# Patient Record
Sex: Female | Born: 1960 | Race: White | Hispanic: No | Marital: Married | Smoking: Never smoker
Health system: Southern US, Community
[De-identification: ages and names within clinical notes are randomized; demographics above are authoritative.]

## PROBLEM LIST (undated history)

## (undated) DIAGNOSIS — E860 Dehydration: Secondary | ICD-10-CM

## (undated) DIAGNOSIS — T7840XA Allergy, unspecified, initial encounter: Secondary | ICD-10-CM

## (undated) DIAGNOSIS — F32A Depression, unspecified: Secondary | ICD-10-CM

## (undated) DIAGNOSIS — J309 Allergic rhinitis, unspecified: Secondary | ICD-10-CM

## (undated) DIAGNOSIS — M8580 Other specified disorders of bone density and structure, unspecified site: Secondary | ICD-10-CM

## (undated) DIAGNOSIS — N390 Urinary tract infection, site not specified: Secondary | ICD-10-CM

## (undated) DIAGNOSIS — J45909 Unspecified asthma, uncomplicated: Secondary | ICD-10-CM

## (undated) DIAGNOSIS — Z9221 Personal history of antineoplastic chemotherapy: Secondary | ICD-10-CM

## (undated) DIAGNOSIS — G43909 Migraine, unspecified, not intractable, without status migrainosus: Secondary | ICD-10-CM

## (undated) DIAGNOSIS — C50919 Malignant neoplasm of unspecified site of unspecified female breast: Secondary | ICD-10-CM

## (undated) DIAGNOSIS — N189 Chronic kidney disease, unspecified: Secondary | ICD-10-CM

## (undated) DIAGNOSIS — F419 Anxiety disorder, unspecified: Secondary | ICD-10-CM

## (undated) DIAGNOSIS — H269 Unspecified cataract: Secondary | ICD-10-CM

## (undated) HISTORY — PX: SINUS SURGERY WITH INSTATRAK: SHX5215

## (undated) HISTORY — DX: Allergic rhinitis, unspecified: J30.9

## (undated) HISTORY — PX: COLONOSCOPY: SHX174

## (undated) HISTORY — PX: MASTECTOMY: SHX3

## (undated) HISTORY — DX: Allergy, unspecified, initial encounter: T78.40XA

## (undated) HISTORY — DX: Unspecified asthma, uncomplicated: J45.909

## (undated) HISTORY — DX: Unspecified cataract: H26.9

## (undated) HISTORY — DX: Depression, unspecified: F32.A

## (undated) HISTORY — DX: Anxiety disorder, unspecified: F41.9

## (undated) HISTORY — DX: Chronic kidney disease, unspecified: N18.9

## (undated) HISTORY — DX: Malignant neoplasm of unspecified site of unspecified female breast: C50.919

## (undated) HISTORY — PX: APPENDECTOMY: SHX54

## (undated) HISTORY — DX: Urinary tract infection, site not specified: N39.0

## (undated) HISTORY — DX: Other specified disorders of bone density and structure, unspecified site: M85.80

---

## 1999-12-16 ENCOUNTER — Other Ambulatory Visit: Admission: RE | Admit: 1999-12-16 | Discharge: 1999-12-16 | Payer: Self-pay | Admitting: Obstetrics and Gynecology

## 2000-01-09 ENCOUNTER — Encounter: Payer: Self-pay | Admitting: Obstetrics and Gynecology

## 2000-01-09 ENCOUNTER — Encounter: Admission: RE | Admit: 2000-01-09 | Discharge: 2000-01-09 | Payer: Self-pay | Admitting: Obstetrics and Gynecology

## 2000-01-20 ENCOUNTER — Encounter: Payer: Self-pay | Admitting: Obstetrics and Gynecology

## 2000-01-20 ENCOUNTER — Encounter: Payer: Self-pay | Admitting: Oncology

## 2000-01-20 ENCOUNTER — Encounter: Admission: RE | Admit: 2000-01-20 | Discharge: 2000-01-20 | Payer: Self-pay | Admitting: Obstetrics and Gynecology

## 2000-01-21 ENCOUNTER — Encounter: Payer: Self-pay | Admitting: Oncology

## 2000-01-21 ENCOUNTER — Encounter: Admission: RE | Admit: 2000-01-21 | Discharge: 2000-01-21 | Payer: Self-pay | Admitting: Oncology

## 2000-02-20 ENCOUNTER — Ambulatory Visit (HOSPITAL_COMMUNITY): Admission: RE | Admit: 2000-02-20 | Discharge: 2000-02-20 | Payer: Self-pay | Admitting: *Deleted

## 2000-03-03 ENCOUNTER — Ambulatory Visit (HOSPITAL_COMMUNITY): Admission: RE | Admit: 2000-03-03 | Discharge: 2000-03-03 | Payer: Self-pay | Admitting: Obstetrics and Gynecology

## 2000-03-03 ENCOUNTER — Encounter (INDEPENDENT_AMBULATORY_CARE_PROVIDER_SITE_OTHER): Payer: Self-pay | Admitting: Specialist

## 2001-01-05 ENCOUNTER — Encounter: Admission: RE | Admit: 2001-01-05 | Discharge: 2001-01-05 | Payer: Self-pay | Admitting: *Deleted

## 2001-01-26 ENCOUNTER — Other Ambulatory Visit: Admission: RE | Admit: 2001-01-26 | Discharge: 2001-01-26 | Payer: Self-pay | Admitting: Obstetrics and Gynecology

## 2001-02-09 ENCOUNTER — Encounter: Payer: Self-pay | Admitting: Oncology

## 2001-02-09 ENCOUNTER — Encounter: Admission: RE | Admit: 2001-02-09 | Discharge: 2001-02-09 | Payer: Self-pay | Admitting: Oncology

## 2002-01-27 ENCOUNTER — Encounter: Payer: Self-pay | Admitting: Obstetrics and Gynecology

## 2002-01-27 ENCOUNTER — Encounter: Admission: RE | Admit: 2002-01-27 | Discharge: 2002-01-27 | Payer: Self-pay | Admitting: Obstetrics and Gynecology

## 2002-02-14 ENCOUNTER — Other Ambulatory Visit: Admission: RE | Admit: 2002-02-14 | Discharge: 2002-02-14 | Payer: Self-pay | Admitting: Obstetrics and Gynecology

## 2002-02-28 ENCOUNTER — Encounter: Admission: RE | Admit: 2002-02-28 | Discharge: 2002-02-28 | Payer: Self-pay | Admitting: Oncology

## 2002-02-28 ENCOUNTER — Encounter: Payer: Self-pay | Admitting: Oncology

## 2003-03-02 ENCOUNTER — Encounter: Payer: Self-pay | Admitting: Oncology

## 2003-03-02 ENCOUNTER — Encounter: Admission: RE | Admit: 2003-03-02 | Discharge: 2003-03-02 | Payer: Self-pay | Admitting: Oncology

## 2003-05-19 ENCOUNTER — Ambulatory Visit (HOSPITAL_COMMUNITY): Admission: RE | Admit: 2003-05-19 | Discharge: 2003-05-19 | Payer: Self-pay | Admitting: Obstetrics and Gynecology

## 2003-05-19 ENCOUNTER — Encounter: Payer: Self-pay | Admitting: Obstetrics and Gynecology

## 2003-06-08 ENCOUNTER — Other Ambulatory Visit: Admission: RE | Admit: 2003-06-08 | Discharge: 2003-06-08 | Payer: Self-pay | Admitting: Obstetrics and Gynecology

## 2004-03-04 ENCOUNTER — Encounter: Admission: RE | Admit: 2004-03-04 | Discharge: 2004-03-04 | Payer: Self-pay | Admitting: Oncology

## 2004-06-18 ENCOUNTER — Other Ambulatory Visit: Admission: RE | Admit: 2004-06-18 | Discharge: 2004-06-18 | Payer: Self-pay | Admitting: Obstetrics and Gynecology

## 2004-06-24 ENCOUNTER — Ambulatory Visit (HOSPITAL_COMMUNITY): Admission: RE | Admit: 2004-06-24 | Discharge: 2004-06-24 | Payer: Self-pay | Admitting: Obstetrics and Gynecology

## 2004-07-19 ENCOUNTER — Ambulatory Visit: Payer: Self-pay | Admitting: Oncology

## 2004-10-16 ENCOUNTER — Ambulatory Visit (HOSPITAL_COMMUNITY): Admission: RE | Admit: 2004-10-16 | Discharge: 2004-10-16 | Payer: Self-pay | Admitting: Obstetrics and Gynecology

## 2004-12-09 ENCOUNTER — Ambulatory Visit (HOSPITAL_COMMUNITY): Admission: RE | Admit: 2004-12-09 | Discharge: 2004-12-09 | Payer: Self-pay | Admitting: Obstetrics and Gynecology

## 2005-01-28 ENCOUNTER — Ambulatory Visit: Payer: Self-pay | Admitting: Internal Medicine

## 2005-03-18 ENCOUNTER — Encounter: Admission: RE | Admit: 2005-03-18 | Discharge: 2005-03-18 | Payer: Self-pay | Admitting: Oncology

## 2005-05-30 ENCOUNTER — Ambulatory Visit: Payer: Self-pay | Admitting: Internal Medicine

## 2005-07-15 ENCOUNTER — Other Ambulatory Visit: Admission: RE | Admit: 2005-07-15 | Discharge: 2005-07-15 | Payer: Self-pay | Admitting: Obstetrics and Gynecology

## 2005-07-18 ENCOUNTER — Ambulatory Visit: Payer: Self-pay | Admitting: Oncology

## 2005-10-10 ENCOUNTER — Ambulatory Visit: Payer: Self-pay | Admitting: Internal Medicine

## 2005-11-21 ENCOUNTER — Ambulatory Visit: Payer: Self-pay | Admitting: Internal Medicine

## 2006-01-12 ENCOUNTER — Other Ambulatory Visit: Admission: RE | Admit: 2006-01-12 | Discharge: 2006-01-12 | Payer: Self-pay | Admitting: Obstetrics and Gynecology

## 2006-02-09 ENCOUNTER — Ambulatory Visit: Payer: Self-pay | Admitting: Internal Medicine

## 2006-02-16 ENCOUNTER — Ambulatory Visit: Payer: Self-pay | Admitting: Internal Medicine

## 2006-02-23 ENCOUNTER — Ambulatory Visit: Payer: Self-pay | Admitting: Internal Medicine

## 2006-03-02 ENCOUNTER — Ambulatory Visit: Payer: Self-pay | Admitting: Internal Medicine

## 2006-03-05 ENCOUNTER — Ambulatory Visit: Payer: Self-pay | Admitting: Internal Medicine

## 2006-03-10 ENCOUNTER — Ambulatory Visit: Payer: Self-pay | Admitting: Internal Medicine

## 2006-03-16 ENCOUNTER — Ambulatory Visit: Payer: Self-pay | Admitting: Internal Medicine

## 2006-03-19 ENCOUNTER — Encounter: Admission: RE | Admit: 2006-03-19 | Discharge: 2006-03-19 | Payer: Self-pay | Admitting: Oncology

## 2006-04-02 ENCOUNTER — Ambulatory Visit: Payer: Self-pay | Admitting: Internal Medicine

## 2006-04-06 ENCOUNTER — Ambulatory Visit: Payer: Self-pay | Admitting: Internal Medicine

## 2006-04-08 ENCOUNTER — Ambulatory Visit: Payer: Self-pay | Admitting: Internal Medicine

## 2006-04-13 ENCOUNTER — Ambulatory Visit: Payer: Self-pay | Admitting: Internal Medicine

## 2006-04-16 ENCOUNTER — Ambulatory Visit: Payer: Self-pay | Admitting: Internal Medicine

## 2006-04-20 ENCOUNTER — Ambulatory Visit: Payer: Self-pay | Admitting: Internal Medicine

## 2006-04-23 ENCOUNTER — Ambulatory Visit: Payer: Self-pay | Admitting: Internal Medicine

## 2006-04-27 ENCOUNTER — Ambulatory Visit: Payer: Self-pay | Admitting: Internal Medicine

## 2006-04-30 ENCOUNTER — Ambulatory Visit: Payer: Self-pay | Admitting: Internal Medicine

## 2006-05-06 ENCOUNTER — Ambulatory Visit: Payer: Self-pay | Admitting: Internal Medicine

## 2006-05-08 ENCOUNTER — Ambulatory Visit: Payer: Self-pay | Admitting: Internal Medicine

## 2006-05-12 ENCOUNTER — Ambulatory Visit: Payer: Self-pay | Admitting: Internal Medicine

## 2006-05-13 ENCOUNTER — Ambulatory Visit: Payer: Self-pay | Admitting: Internal Medicine

## 2006-05-14 ENCOUNTER — Ambulatory Visit: Payer: Self-pay | Admitting: Internal Medicine

## 2006-05-19 ENCOUNTER — Ambulatory Visit: Payer: Self-pay | Admitting: Internal Medicine

## 2006-05-22 ENCOUNTER — Ambulatory Visit: Payer: Self-pay | Admitting: Internal Medicine

## 2006-05-26 ENCOUNTER — Ambulatory Visit: Payer: Self-pay | Admitting: Internal Medicine

## 2006-05-29 ENCOUNTER — Ambulatory Visit: Payer: Self-pay | Admitting: Internal Medicine

## 2006-06-02 ENCOUNTER — Ambulatory Visit: Payer: Self-pay | Admitting: Internal Medicine

## 2006-06-05 ENCOUNTER — Ambulatory Visit: Payer: Self-pay | Admitting: Internal Medicine

## 2006-06-08 ENCOUNTER — Ambulatory Visit: Payer: Self-pay | Admitting: Internal Medicine

## 2006-06-09 ENCOUNTER — Ambulatory Visit: Payer: Self-pay | Admitting: Internal Medicine

## 2006-06-15 ENCOUNTER — Ambulatory Visit: Payer: Self-pay | Admitting: Internal Medicine

## 2006-08-05 ENCOUNTER — Ambulatory Visit: Payer: Self-pay | Admitting: Oncology

## 2006-08-10 LAB — CBC WITH DIFFERENTIAL/PLATELET
Basophils Absolute: 0 10*3/uL (ref 0.0–0.1)
EOS%: 3.9 % (ref 0.0–7.0)
HCT: 36.6 % (ref 34.8–46.6)
HGB: 12.7 g/dL (ref 11.6–15.9)
LYMPH%: 30.1 % (ref 14.0–48.0)
MCH: 32.4 pg (ref 26.0–34.0)
MCV: 93.6 fL (ref 81.0–101.0)
MONO%: 14.4 % — ABNORMAL HIGH (ref 0.0–13.0)
NEUT%: 51.1 % (ref 39.6–76.8)
Platelets: 214 10*3/uL (ref 145–400)
RDW: 12.8 % (ref 11.3–14.5)

## 2006-08-10 LAB — COMPREHENSIVE METABOLIC PANEL
AST: 16 U/L (ref 0–37)
Alkaline Phosphatase: 85 U/L (ref 39–117)
BUN: 11 mg/dL (ref 6–23)
Creatinine, Ser: 0.85 mg/dL (ref 0.40–1.20)
Glucose, Bld: 79 mg/dL (ref 70–99)
Total Bilirubin: 0.3 mg/dL (ref 0.3–1.2)

## 2006-09-18 ENCOUNTER — Ambulatory Visit: Payer: Self-pay | Admitting: Internal Medicine

## 2006-10-12 ENCOUNTER — Ambulatory Visit: Payer: Self-pay | Admitting: Internal Medicine

## 2006-11-02 ENCOUNTER — Ambulatory Visit: Payer: Self-pay | Admitting: Internal Medicine

## 2006-11-23 ENCOUNTER — Ambulatory Visit: Payer: Self-pay | Admitting: Internal Medicine

## 2007-01-13 ENCOUNTER — Other Ambulatory Visit: Admission: RE | Admit: 2007-01-13 | Discharge: 2007-01-13 | Payer: Self-pay | Admitting: Obstetrics and Gynecology

## 2007-01-20 ENCOUNTER — Encounter: Admission: RE | Admit: 2007-01-20 | Discharge: 2007-01-20 | Payer: Self-pay | Admitting: Obstetrics and Gynecology

## 2007-02-26 ENCOUNTER — Ambulatory Visit: Payer: Self-pay | Admitting: Internal Medicine

## 2007-03-04 ENCOUNTER — Encounter: Admission: RE | Admit: 2007-03-04 | Discharge: 2007-03-04 | Payer: Self-pay | Admitting: Family Medicine

## 2007-03-22 ENCOUNTER — Encounter: Admission: RE | Admit: 2007-03-22 | Discharge: 2007-03-22 | Payer: Self-pay | Admitting: Oncology

## 2007-04-26 ENCOUNTER — Ambulatory Visit: Payer: Self-pay | Admitting: Internal Medicine

## 2007-05-25 ENCOUNTER — Ambulatory Visit: Payer: Self-pay | Admitting: Internal Medicine

## 2007-08-02 ENCOUNTER — Ambulatory Visit: Payer: Self-pay | Admitting: Oncology

## 2007-08-09 ENCOUNTER — Encounter: Payer: Self-pay | Admitting: Internal Medicine

## 2007-08-09 LAB — CBC WITH DIFFERENTIAL/PLATELET
Basophils Absolute: 0.1 10*3/uL (ref 0.0–0.1)
EOS%: 3.6 % (ref 0.0–7.0)
Eosinophils Absolute: 0.2 10*3/uL (ref 0.0–0.5)
HCT: 39.6 % (ref 34.8–46.6)
HGB: 14 g/dL (ref 11.6–15.9)
MCH: 32.6 pg (ref 26.0–34.0)
MCV: 92.1 fL (ref 81.0–101.0)
MONO%: 7.3 % (ref 0.0–13.0)
NEUT#: 2.4 10*3/uL (ref 1.5–6.5)
NEUT%: 53.8 % (ref 39.6–76.8)

## 2007-08-09 LAB — COMPREHENSIVE METABOLIC PANEL
AST: 22 U/L (ref 0–37)
Albumin: 4.7 g/dL (ref 3.5–5.2)
BUN: 20 mg/dL (ref 6–23)
Calcium: 10.1 mg/dL (ref 8.4–10.5)
Chloride: 101 mEq/L (ref 96–112)
Creatinine, Ser: 1.13 mg/dL (ref 0.40–1.20)
Glucose, Bld: 70 mg/dL (ref 70–99)
Potassium: 3.8 mEq/L (ref 3.5–5.3)

## 2007-09-21 ENCOUNTER — Encounter: Admission: RE | Admit: 2007-09-21 | Discharge: 2007-09-21 | Payer: Self-pay | Admitting: Endocrinology

## 2007-10-25 DIAGNOSIS — Z9010 Acquired absence of unspecified breast and nipple: Secondary | ICD-10-CM | POA: Insufficient documentation

## 2007-10-25 DIAGNOSIS — J45998 Other asthma: Secondary | ICD-10-CM

## 2007-10-25 DIAGNOSIS — J302 Other seasonal allergic rhinitis: Secondary | ICD-10-CM

## 2007-10-25 DIAGNOSIS — J3089 Other allergic rhinitis: Secondary | ICD-10-CM

## 2007-10-26 ENCOUNTER — Encounter: Payer: Self-pay | Admitting: Internal Medicine

## 2007-11-18 ENCOUNTER — Ambulatory Visit: Payer: Self-pay | Admitting: Internal Medicine

## 2008-03-15 ENCOUNTER — Ambulatory Visit: Payer: Self-pay | Admitting: Internal Medicine

## 2008-03-22 ENCOUNTER — Encounter: Admission: RE | Admit: 2008-03-22 | Discharge: 2008-03-22 | Payer: Self-pay | Admitting: Oncology

## 2008-06-12 ENCOUNTER — Ambulatory Visit: Payer: Self-pay | Admitting: Internal Medicine

## 2008-08-04 ENCOUNTER — Ambulatory Visit: Payer: Self-pay | Admitting: Oncology

## 2008-08-08 ENCOUNTER — Encounter: Payer: Self-pay | Admitting: Internal Medicine

## 2008-08-08 LAB — COMPREHENSIVE METABOLIC PANEL
AST: 19 U/L (ref 0–37)
Albumin: 4.4 g/dL (ref 3.5–5.2)
Alkaline Phosphatase: 59 U/L (ref 39–117)
Glucose, Bld: 71 mg/dL (ref 70–99)
Potassium: 4.1 mEq/L (ref 3.5–5.3)
Sodium: 140 mEq/L (ref 135–145)
Total Bilirubin: 0.4 mg/dL (ref 0.3–1.2)
Total Protein: 6.7 g/dL (ref 6.0–8.3)

## 2008-08-08 LAB — CBC WITH DIFFERENTIAL/PLATELET
EOS%: 2.9 % (ref 0.0–7.0)
Eosinophils Absolute: 0.1 10*3/uL (ref 0.0–0.5)
LYMPH%: 33.7 % (ref 14.0–48.0)
MCH: 32.8 pg (ref 26.0–34.0)
MCHC: 34.9 g/dL (ref 32.0–36.0)
MCV: 94.1 fL (ref 81.0–101.0)
MONO%: 9.9 % (ref 0.0–13.0)
Platelets: 210 10*3/uL (ref 145–400)
RBC: 4.16 10*6/uL (ref 3.70–5.32)
RDW: 12.7 % (ref 11.3–14.5)

## 2008-08-23 ENCOUNTER — Ambulatory Visit: Payer: Self-pay | Admitting: Internal Medicine

## 2008-10-18 ENCOUNTER — Other Ambulatory Visit: Admission: RE | Admit: 2008-10-18 | Discharge: 2008-10-18 | Payer: Self-pay | Admitting: Obstetrics and Gynecology

## 2009-01-30 ENCOUNTER — Ambulatory Visit: Payer: Self-pay | Admitting: Internal Medicine

## 2009-03-23 ENCOUNTER — Encounter: Admission: RE | Admit: 2009-03-23 | Discharge: 2009-03-23 | Payer: Self-pay | Admitting: Oncology

## 2009-05-25 ENCOUNTER — Ambulatory Visit: Payer: Self-pay | Admitting: Oncology

## 2009-05-28 ENCOUNTER — Ambulatory Visit: Payer: Self-pay | Admitting: Internal Medicine

## 2009-05-29 LAB — COMPREHENSIVE METABOLIC PANEL
ALT: 20 U/L (ref 0–35)
AST: 20 U/L (ref 0–37)
Albumin: 4.5 g/dL (ref 3.5–5.2)
Alkaline Phosphatase: 79 U/L (ref 39–117)
Glucose, Bld: 109 mg/dL — ABNORMAL HIGH (ref 70–99)
Potassium: 4 mEq/L (ref 3.5–5.3)
Sodium: 142 mEq/L (ref 135–145)
Total Bilirubin: 0.3 mg/dL (ref 0.3–1.2)
Total Protein: 6.8 g/dL (ref 6.0–8.3)

## 2009-05-29 LAB — CBC WITH DIFFERENTIAL/PLATELET
BASO%: 1 % (ref 0.0–2.0)
EOS%: 3.4 % (ref 0.0–7.0)
Eosinophils Absolute: 0.2 10*3/uL (ref 0.0–0.5)
LYMPH%: 34.4 % (ref 14.0–49.7)
MCHC: 34.2 g/dL (ref 31.5–36.0)
MCV: 91.4 fL (ref 79.5–101.0)
MONO%: 9.2 % (ref 0.0–14.0)
NEUT#: 2.6 10*3/uL (ref 1.5–6.5)
RBC: 4.29 10*6/uL (ref 3.70–5.45)
RDW: 12.6 % (ref 11.2–14.5)

## 2009-07-03 ENCOUNTER — Encounter: Admission: RE | Admit: 2009-07-03 | Discharge: 2009-07-03 | Payer: Self-pay | Admitting: Oncology

## 2009-07-20 ENCOUNTER — Ambulatory Visit: Payer: Self-pay | Admitting: Oncology

## 2009-08-14 ENCOUNTER — Ambulatory Visit: Payer: Self-pay | Admitting: Internal Medicine

## 2009-11-28 ENCOUNTER — Other Ambulatory Visit: Admission: RE | Admit: 2009-11-28 | Discharge: 2009-11-28 | Payer: Self-pay | Admitting: Obstetrics and Gynecology

## 2010-04-24 ENCOUNTER — Ambulatory Visit: Payer: Self-pay | Admitting: Internal Medicine

## 2010-04-24 ENCOUNTER — Encounter: Admission: RE | Admit: 2010-04-24 | Discharge: 2010-04-24 | Payer: Self-pay | Admitting: Oncology

## 2010-06-27 ENCOUNTER — Ambulatory Visit: Payer: Self-pay | Admitting: Internal Medicine

## 2010-06-27 ENCOUNTER — Telehealth: Payer: Self-pay | Admitting: Internal Medicine

## 2010-07-22 ENCOUNTER — Ambulatory Visit: Payer: Self-pay | Admitting: Oncology

## 2010-07-24 LAB — CBC WITH DIFFERENTIAL/PLATELET
BASO%: 0.6 % (ref 0.0–2.0)
Basophils Absolute: 0 10*3/uL (ref 0.0–0.1)
EOS%: 3.1 % (ref 0.0–7.0)
Eosinophils Absolute: 0.1 10*3/uL (ref 0.0–0.5)
HCT: 34.9 % (ref 34.8–46.6)
HGB: 11.9 g/dL (ref 11.6–15.9)
LYMPH%: 35.3 % (ref 14.0–49.7)
MCH: 32.5 pg (ref 25.1–34.0)
MCHC: 34.2 g/dL (ref 31.5–36.0)
MCV: 95.1 fL (ref 79.5–101.0)
MONO#: 0.5 10*3/uL (ref 0.1–0.9)
MONO%: 12 % (ref 0.0–14.0)
NEUT#: 2.1 10*3/uL (ref 1.5–6.5)
NEUT%: 49 % (ref 38.4–76.8)
Platelets: 262 10*3/uL (ref 145–400)
RBC: 3.67 10*6/uL — ABNORMAL LOW (ref 3.70–5.45)
RDW: 13 % (ref 11.2–14.5)
WBC: 4.3 10*3/uL (ref 3.9–10.3)
lymph#: 1.5 10*3/uL (ref 0.9–3.3)

## 2010-07-24 LAB — MORPHOLOGY
PLT EST: ADEQUATE
RBC Comments: NORMAL

## 2010-09-28 ENCOUNTER — Other Ambulatory Visit: Payer: Self-pay | Admitting: Oncology

## 2010-09-28 DIAGNOSIS — Z1239 Encounter for other screening for malignant neoplasm of breast: Secondary | ICD-10-CM

## 2010-09-29 ENCOUNTER — Encounter: Payer: Self-pay | Admitting: Oncology

## 2010-10-03 ENCOUNTER — Ambulatory Visit: Payer: Self-pay | Admitting: Internal Medicine

## 2010-10-08 NOTE — Progress Notes (Signed)
Summary: nos appt  Phone Note Call from Patient   Caller: Patient Call For: Wynette Jersey Summary of Call: Rsc nos from 10/19 to 06/26/2011. Initial call taken by: Darletta Moll,  June 27, 2010 3:37 PM

## 2010-10-08 NOTE — Assessment & Plan Note (Signed)
Summary: rov//mbw   Primary [name]:  [name]  CC:  Follow up visit-allergies.  History of Present Illness: History of Present Illness: 06/12/08- 50 yo woman with hx allergic rhinitis and recurrent rhinosinusitis. Mild intermittent asthma.  One year f/u/.  In July flare, treated Urgent Care with inj, pred, abx.  Comfortable today. Allergy vaccine discussed.  Jun 21, 2009- Asthma, allergic rhinitis 2 weeks of watery rhinorhea with some frontal headache. It helps to do nasal saline rinse. Denies fever, purulence, blood, wheeze or cough. No recognized triggering exposure except Fall season. She continues allergy vaccine with no problems and had done well til now.   June 27, 2010- Asthma, allergic rhinitis Nurse CC: Follow up visit-allergies One year f/u, needing flu vax . Had 2 epoisodes of rhintis with head congestion but no wheeze or lung discomfort. The most recent was during the midsummer. She blames "allergies" although it wasn't pollen season. Antihistmines took care of her. Doing well with allergy shots- discussed.    Asthma History    Initial Asthma Severity Rating:    Age range: 12+ years    Symptoms: 0-2 days/week    Nighttime Awakenings: 0-2/month    Interferes w/ normal activity: no limitations    SABA use (not for EIB): 0-2 days/week    Asthma Severity Assessment: Intermittent   Preventive Screening-Counseling & Management  Alcohol-Tobacco     Smoking Status: never  Current Medications (verified): 1)  Wellbutrin Xl 300 Mg Xr24h-Tab (Bupropion Hcl) .... Take 1 By Mouth Once Daily 2)  Vitamin D 2000 Unit Tabs (Cholecalciferol) .... Take 1 By Mouth Once Daily 3)  Flexeril 10 Mg Tabs (Cyclobenzaprine Hcl) .... Take 1 By Mouth Once Daily 4)  Xanax 0.5 Mg Tabs (Alprazolam) .... Take As Directed As Needed 5)  Magnesium 500 Mg Tabs (Magnesium) .... Take 2 By Mouth Once Daily 6)  Treximet 85-500 Mg Tabs (Sumatriptan-Naproxen Sodium) .... Take As  Needed 7)  Boniva 150 Mg Tabs (Ibandronate Sodium) 8)  Cymbalta 30 Mg Cpep (Duloxetine Hcl) .... Take 1 By Mouth Once Daily 9)  Proair Hfa 108 (90 Base) Mcg/act Aers (Albuterol Sulfate) .... 2puffs Four Times A Day As Needed 10)  Allergy Vaccine Go (W-E) 1:10 11)  Epipen 0.3 Mg/0.44ml (1:1000) Devi (Epinephrine Hcl (Anaphylaxis)) .... For Severe Allergic Reaction  Allergies (verified): No Known Drug Allergies  Past History:  Past Medical History: Last updated: 06/12/2008 ASTHMA (ICD-493.90) ALLERGIC RHINITIS (ICD-477.9)  Breast Cancer-MASTECTOMY, LEFT, HX OF (ICD-V45.71)/ chemo  Past Surgical History: Last updated: 06/12/2008 left mastectlomy/ chemo Sinus surgery  Family History: Last updated: 06/21/09 Father- died Lambert-E[place] neuromuscular degenerative disease  Social History: Last updated: Jun 21, 2009 Patient never smoked.  Married IT security   Risk Factors: Smoking Status: never (06/27/2010)  Review of Systems      See HPI       The patient complains of nasal congestion/difficulty breathing through nose.  The patient denies shortness of breath with activity, shortness of breath at rest, productive cough, non-productive cough, coughing up blood, chest pain, irregular heartbeats, acid heartburn, indigestion, loss of appetite, weight change, abdominal pain, difficulty swallowing, sore throat, tooth/dental problems, headaches, and sneezing.    Vital Signs:  Patient profile:   50 year old female Height:      64 inches Weight:      153.50 pounds BMI:     26.44 O2 Sat:      99 % on Room air Pulse rate:   104 / minute BP sitting:   118 /  82  (left arm) Cuff size:   regular  Vitals Entered By: [name] CMA (June 27, 2010 2:18 PM)  O2 Flow:  Room air CC: Follow up visit-allergies   Physical Exam  Additional Exam:  General: A/Ox3; pleasant and cooperative, NAD, WDWN SKIN: no rash, lesions NODES: no lymphadenopathy HEENT: [place]/AT, EOM- WNL, Conjuctivae-  clear, PERRLA, TM-WNL, Nose- watery sniffing, Throat- clear and wnl, Mallampati  III NECK: Supple w/ fair ROM, JVD- none, normal carotid impulses w/o bruits Thyroid-  CHEST: Clear to P&A, left mastect HEART: RRR, no m/g/r heard ABDOMEN: Soft and nl;  ZOX:WRUE, nl pulses, no edema  NEURO: Grossly intact to observation      Impression & Recommendations:  Problem # 1:  ASTHMA (ICD-493.90) Good control. We will refill a rescue inhaler for very occasional use, but she doesn't need a maintenance med.   Problem # 2:  ALLERGIC RHINITIS (ICD-477.9)  Good control, and allergy vaccine has definitely been helpful. We will renew Epipen. The following medications were removed from the medication list:    Fluticasone Propionate 50 Mcg/act Susp (Fluticasone propionate) .Marland Kitchen... 1-2 sprays each nostril once daily  Medications Added to Medication List This Visit: 1)  Cymbalta 30 Mg Cpep (Duloxetine hcl) .... Take 1 by mouth once daily  Other Orders: Est. Patient Level III (45409)  Patient Instructions: 1)  Please schedule a follow-up appointment in 1 year. 2)  Refills for Epipen and for Proair rescue inhaler Prescriptions: EPIPEN 0.3 MG/0.3ML (1:1000) DEVI (EPINEPHRINE HCL (ANAPHYLAXIS)) For severe allergic reaction  #1 x prn   Entered and Authorized by:   Waymon Budge MD   Signed by:   Waymon Budge MD on 06/27/2010   Method used:   Print then Give to Patient   RxID:   [id number] PROAIR HFA 108 (90 BASE) MCG/ACT AERS (ALBUTEROL SULFATE) 2puffs four times a day as needed  #1 x prn   Entered and Authorized by:   Waymon Budge MD   Signed by:   Waymon Budge MD on 06/27/2010   Method used:   Print then Give to Patient   RxID:   [id number]

## 2010-11-13 ENCOUNTER — Other Ambulatory Visit (HOSPITAL_COMMUNITY)
Admission: RE | Admit: 2010-11-13 | Discharge: 2010-11-13 | Disposition: A | Discharge status: Home / Self Care | Payer: 59 | Source: Ambulatory Visit | Attending: Obstetrics and Gynecology | Admitting: Obstetrics and Gynecology

## 2010-11-13 ENCOUNTER — Other Ambulatory Visit: Payer: Self-pay | Admitting: Obstetrics and Gynecology

## 2010-11-13 DIAGNOSIS — Z01419 Encounter for gynecological examination (general) (routine) without abnormal findings: Secondary | ICD-10-CM | POA: Insufficient documentation

## 2011-01-21 ENCOUNTER — Encounter: Payer: Self-pay | Admitting: Internal Medicine

## 2011-01-21 ENCOUNTER — Ambulatory Visit (INDEPENDENT_AMBULATORY_CARE_PROVIDER_SITE_OTHER): Payer: 59 | Admitting: Internal Medicine

## 2011-01-21 VITALS — BP 102/62 | HR 104 | Ht 64.0 in | Wt 157.4 lb

## 2011-01-21 DIAGNOSIS — J309 Allergic rhinitis, unspecified: Secondary | ICD-10-CM

## 2011-01-21 DIAGNOSIS — J45909 Unspecified asthma, uncomplicated: Secondary | ICD-10-CM

## 2011-01-21 MED ORDER — PHENYLEPHRINE HCL 1 % NA SOLN
3.0000 [drp] | Freq: Once | NASAL | Status: AC
  Administered 2011-01-21: 3 [drp] via NASAL

## 2011-01-21 MED ORDER — METHYLPREDNISOLONE ACETATE 80 MG/ML IJ SUSP
80.0000 mg | Freq: Once | INTRAMUSCULAR | Status: AC
  Administered 2011-01-21: 80 mg via INTRAMUSCULAR

## 2011-01-21 MED ORDER — ALBUTEROL SULFATE HFA 108 (90 BASE) MCG/ACT IN AERS: 2.0000 | INHALATION_SPRAY | Freq: Four times a day (QID) | RESPIRATORY_TRACT | Status: DC | PRN

## 2011-01-21 NOTE — Assessment & Plan Note (Signed)
Abrupt onset sounds more like a viral pattern URI than allergy "attack" but not specific. We will give neb and depo now. Ok to use antihistamines, decongestants and saline rinse. Call for antibiotic if needed.

## 2011-01-21 NOTE — Assessment & Plan Note (Signed)
 HEALTHCARE                             PULMONARY OFFICE NOTE   COLETA, GROSSHANS                     MRN:          [id number]  DATE:04/26/2007                            DOB:          11/19/1960    PROBLEM:  1. Asthma.  2. Allergic rhinitis.  3. Left mastectomy.   HISTORY:  One month of frontal headache, which happens to be gone on the  day of this examination. She feels relieved as she can get some drainage  going. Nothing purulent. She does take Sudafed. She has not recognized  an aggravating factor in the environment. Sometimes for headache she  will take Benadryl. She has tried saline lavage without dramatic  benefit. She continues allergy vaccine at 1:10 and that does seem to  help her over the longterm.   MEDICATIONS:  1. Fosamax.  2. Treva.  3. Wellbutrin 150 mg.  4. Allergy vaccine.  5. Vitamin D.  6. P.r.n. use of Flexeril, Xanax and albuterol inhaler.  7. She has an Epipen.   DRUG INTOLERANCES:  No medication allergy.   OBJECTIVE:  Weight 158 pounds, blood pressure 102/64, pulse 77.  Room  air saturation 99%.  There is some white mucus in the nasal airways and  some turbinate edema. No periorbital edema or conjunctival injection. No  adenopathy. She seems able to breath through her nose with her mouth  closed. Voice quality is normal.  LUNGS:  Clear.  HEART: Regular without murmur or gallop.   IMPRESSION:  1. Headache.  2. Rhinitis with question of sinusitis.  3. Mild intermittent asthma, currently inactive.   PLAN:  1. Nasal nebulized treatment with Neo-Synephrine.  2. Depo-Medrol 80 mg IM with steroid talk.  3. Try sample of Patanase.  4. Refill albuterol.  5. Schedule return in six months, but earlier p.r.n. failure to clear.     Clinton D. Maple Hudson, MD, Tonny Bollman, FACP  Electronically Signed    CDY/MedQ  DD: 04/28/2007  DT: 04/29/2007  Job #: [id number]

## 2011-01-21 NOTE — Assessment & Plan Note (Signed)
Transient wheeze at the onset of the acute illness, but controlled now.

## 2011-01-21 NOTE — Progress Notes (Signed)
Subjective:     Patient ID: [name], female   DOB: Jul 02, 1961, 50 y.o.   MRN: [id number]  HPI 01/21/11- 33 yoF never smoker followed for allergic rhinitis, asthma, complicated by hx breast cancer/ mastectomy.  Last here Jun 27, 2010 Had done well through winter and spring till she noted onset of nasal drainage and some chest tightness while out shopping. No fever, sore throat or discolored sputum. Went to UC 4 days ago. Told to take allegra. Now chest feels well. Head still draining. A little cough, no wheeze. Light headed. Onset with some headache, not now. Has not had problem with her allergy shots still at 1:10.  Review of Systems Constitutional:   No weight loss, night sweats,  , chills, fatigue, lassitude. HEENT:   No headaches,  Difficulty swallowing,  Tooth/dental problems,  Sore throat,                CV:  No chest pain,  Orthopnea, PND, swelling in lower extremities, anasarca, dizziness, palpitations  GI  No heartburn, indigestion, abdominal pain, nausea, vomiting, diarrhea, change in bowel habits, loss of appetite  Resp: No shortness of breath with exertion or at rest.  No excess mucus, no productive cough,  No non-productive cough,  No coughing up of blood.  No change in color of mucus.   Skin: no rash or lesions.  GU: no dysuria, change in color of urine, no urgency or frequency.  No flank pain.  MS:  No joint pain or swelling.  No decreased range of motion.  No back pain.  Psych:  No change in mood or affect. No depression or anxiety.  No memory loss.     Objective:   Physical Exam General- Alert, Oriented, Affect-appropriate, Distress- none acute  Skin- rash-none, lesions- none, excoriation- none  Lymphadenopathy- none  Head- atraumatic  Eyes- Gross vision intact, PERRLA, conjunctivae clear secretions  Ears- Hearing, canals, Tm - normal  Nose- Clear, no- Septal dev, mucus, polyps, erosion, perforation   Throat- Mallampati II , mucosa clear , drainage-  none seen , tonsils- atrophic  Neck- flexible , trachea midline, no stridor , thyroid nl, carotid no bruit  Chest - symmetrical excursion , unlabored     Heart/CV- RRR , no murmur , no gallop  , no rub, nl s1 s2                     - JVD- none , edema- none, stasis changes- none, varices- none     Lung- clear to P&A, wheeze- none, cough- none , dullness-none, rub- none     Chest wall- mastectomy Abd- tender-no, distended-no, bowel sounds-present, HSM- no  Br/ Gen/ Rectal- Not done, not indicated  Extrem- cyanosis- none, clubbing, none, atrophy- none, strength- nl  Neuro- grossly intact to observation       Assessment:         Plan:

## 2011-01-21 NOTE — Patient Instructions (Signed)
Neb nasal neo  Depo 66  Ok to use saline rinse, antihistamines, decongestants and mucinex as you are doing. Call if you find you need an antibiotic.

## 2011-01-24 NOTE — H&P (Signed)
[facility]  PatientALAIJA, [name]                        MRN: [id number] Adm. Date:  03/03/00 Attending:  Esmeralda Arthur, M.D. CC:         Outpatient                         History and Physical  HISTORY OF PRESENT ILLNESS:   This is a 50 year old female para 2-0-0-2 who is to have a D&C and hysteroscopy and resection of the endometrium.  The patient is on tamoxifen.  She had some irregular periods and she had an ultrasound which showed that the endometrium was 5 mg.  She has had no bleeding since March of 2001.  Ultrasound was done on Jan 07, 2000, with 5 mm endometrium. The patient is on tamoxifen and no other hormonal medication.  Her left adnexa reveals a 5.1 x 2.3 x 4.1 ovary with a simple cyst, a 2.4 x 2.1 x 2.7 cyst of 2.1 x 1.8 x 2.  She had no free fluid in the cul-de-sac.  She had a CAT scan of the pelvis which showed that abdominal parenchymal organs are normal. There was no evidence of mass, adenopathy or inflammatory process.  The uterus appeared to be retroverted and feels enlarged.  She had fibroids, it looked like, on CAT scan and the left ovary contained two cysts which measured 2 cm. The right ovary contained a probable small adjacent __________ cyst and intestines and bladder appeared normal.  She thought she had a fibroid in the fundus.  This is consistent with her ultrasound except we were not able to visualize the fundal fibroid that well.  The patient is on tamoxifen.  She was given Provera and she did not have any bleeding. She had a 7 mm endometrium lining.  Therefore, she is admitted for evaluation of thickened endometrium on tamoxifen.  ALLERGIES:                    No known drug allergies.  GYNECOLOGIC HISTORY:          Her last Pap smear was April of 2001.  Her last menstrual period was March of 2001.  It was spontaneous.  She has had hot flushes.  MEDICATIONS:                  She is taking Zoloft for hot  flushes, tamoxifen 20 mg every day.  Progesterone 10 mg one through 10 of each month.  She takes vitamins and she takes calcium.  PAST MEDICAL HISTORY:         She has had a weight loss over a period of time for a treatment she has had for breast cancer.  She does have sinus problems. She has some dyspnea on exertion.  She has shortness of breath to go along with the dyspnea.  She has constipation.  PAST SURGICAL HISTORY:        She had a mastectomy on the left and reconstruction.  She has sinus problems.  FAMILY HISTORY:               There is no history of breast, colon or ovarian cancer.  Her mother has osteoporosis.  Father has high blood pressure and heart disease.  PHYSICAL EXAMINATION:  GENERAL APPEARANCE:  A well-developed, well-nourished female.  VITAL SIGNS:                  Blood pressure 108/60, weight 130, height is 5 feet 4 inches.  NECK:                         Thyroid is not palpable.  LUNGS:                        Clear to percussion and auscultation.  CARDIOVASCULAR:               Normal sinus rhythm.  BREASTS:                      _________, without masses.  The left breast is absent.  ABDOMEN:                      The liver is not enlarged.  The spleen is not enlarged.  VAGINA:                       She has good support. She has vaginal dryness. Cervix is epithelialized.  The uterus is posterior and feels slightly enlarged which will go along with the fibroid.  No masses are felt in the adnexa.  I cannot feel the cyst and the perineum is within normal limits.  IMPRESSION:                   Thickened endometrium, some irregular spotting on tamoxifen for breast cancer, previous surgery for breast cancer, symptomatic retroflexed uterus.  DISPOSITION:                  Admit for surgery. DD:  03/03/00 TD:  03/03/00 Job: [id number]

## 2011-01-24 NOTE — Assessment & Plan Note (Signed)
[facility]                             PULMONARY OFFICE NOTE   [name], [name]                     MRN:          [id number]  DATE:10/12/2006                            DOB:          [birth year]    PULMONARY FOLLOW-UP:   PROBLEMS:  1. Asthma.  2. Allergic rhinitis.  3. Left mastectomy.   HISTORY:  She came seeking peace of mind, saying that she had had some  soreness, mainly tenderness to her own pressure, along the bilateral  anterior lower costal margin or upper abdomen.  This has been going on  about 2 weeks.  She has had some cough with postnasal drip which she  calls allergy, saying an inhaler helped her.  It is not clear that  that cough causes the pain, but she recognizes no relationship to  position, eating or bowels.  Dr. Kinnie Scales had done a colonoscopy for  follow-up of polyps and chronic constipation in December.  She wanted  some reassurance because of her history of breast cancer but otherwise  has noted nothing new.  She continues allergy vaccine, giving her own at  1:50 with no problems.   MEDICATIONS:  1. Fosamax once a week.  2. Calcium.  3. Multivitamin.  4. Wellbutrin 150 mg.  5. Celexa 30 mg.  6. Use p.r.n. of Flexeril, Xanax, albuterol inhaler, and she has an      EpiPen.   No medication allergy.   OBJECTIVE:  VITAL SIGNS:  Weight 165 pounds, BP 106/64, pulse regular at  78, room air saturation 100%.  HEENT:  There is a long palate obscuring the posterior pharynx, but I  see no evidence of rhinorrhea or pharyngitis.  Voice quality is normal.  There is no stridor.  NECK:  I find no adenopathy.  No neck vein distention.  CHEST:  Quiet and clear with no cough or wheeze.  No pleural rub.  CARDIAC:  Heart sounds are regular without murmur.  I do not feel  enlargement of liver or spleen.  ABDOMEN:  She is not obviously tender to pressure across the upper  abdomen or lower costal margins.   IMPRESSION:  1. Mild  exacerbation of asthma with bronchitis, probably viral.  2. Musculoskeletal pain, probably in the abdominal wall and related to      coughing.   PLAN:  1. Sample Symbicort 160/4.5 two puffs b.i.d.  2. Chest x-ray.  3. Schedule return in 6 weeks, earlier p.r.n.     Clinton D. Maple Hudson, MD, Tonny Bollman, FACP  Electronically Signed    CDY/MedQ  DD: 10/12/2006  DT: 10/13/2006  Job #: [id number]

## 2011-01-24 NOTE — Op Note (Signed)
[facility]  Patient:    [name], [name]                     MRN: [id number] Proc. Date: 03/03/00 Adm. Date:  [id number] Disc. Date: [id number] Attending:  Amanda Cockayne                           Operative Report  PREOPERATIVE DIAGNOSIS:       On Tamoxifen for breast cancer, some irregular                               bleeding, thickened posterior endometrium.  OPERATION:                    Dilatation of the cervix, hysteroscopy,                               curettement, and suture of laceration of the                               cervix.  SURGEON:                      Esmeralda Arthur, M.D.  ANESTHESIA:                   General  PACKS:                        None.  CATHETERS:                    None.  MEDIUM:                       Sorbitol.  DEFICIT:                      40 cc  DESCRIPTION OF PROCEDURE:     The patient was carried to the operating and after satisfactory general anesthesia, the patient in the lithotomy position, prepped and draped in a sterile field.  Bladder was emptied by catheterization.  Examination revealed the uterus to be slightly posteriorly.  No masses were felt.  Weight speculum was placed in the posterior vagina.  The cervix was grasped with a tenaculum and sounded to 7.5 cm.  Cervix was then dilated to a #31 with some difficulty.  We then spent approximately 10 to 15 minutes trying to dilate her endocervix to a #33 and I could not.  I lacerated the cervix and then went to a Jacob tenaculum and I simply could not dilate her.  We then went back to the observation scope to look and I could see no masses but I could not see this distance we dilated.  On ultrasound we saw a 5 mm thickened endometrium and I was going to resect this but instead I did a curettement with a very small amount of tissue.  After adequate curettement we then repaired the cervix with 3-0 chromic interrupted sutures until  we had good hemostasis.  The patient was observed and the procedure was terminated.  She was carried to the recovery room in good condition. DD:  03/03/00 TD:  03/05/00 Job: [id number]

## 2011-01-24 NOTE — Assessment & Plan Note (Signed)
[facility]                               PULMONARY OFFICE NOTE   ARLET, MARTER                     MRN:          [id number]  DATE:06/09/2006                            DOB:          03-25-1961    PULMONARY/ALLERGY FOLLOWUP   PROBLEM:  1. Asthma.  2. Allergic rhinitis.  3. Left mastectomy.   HISTORY:  She is now 9 years out from her mastectomy with no recurrence.  She has been building her allergy vaccine since re-testing in June.  She had  given her own allergy shots for years, and wishes to do so again.  We have  again gone through our discussion of issues related to administration  outside of a medical office, anaphylaxis, epinephrine and choices.  She has  only used her albuterol inhaler twice this summer.  Today she complains that  in the last day or so she has had some frontal and retro orbital headache  pressure she considers sinus.  She is treating with over-the-counter meds,  and has had nothing purulent or bloody, little sneeze.  A sense of mild  nasal congestion with no wheeze or cough.   MEDICATIONS:  1. Fosamax.  2. Calcium.  3. Vitamin C.  4. Wellbutrin 150 mg.  5. Allergy shots.  6. Occasional Flexeril.  7. Xanax.  8. Albuterol inhaler.   ALLERGIES:  No medication allergy.   OBJECTIVE:  Weight 154 pounds, BP 112/62, pulse regular 75, room air  saturation 97%.  There is some white mucus in her nostrils without obstruction.  Conjunctivae  are not injected, pharynx is clear.  There is no adenopathy, voice quality  is normal.  LUNGS:  Clear.  CARDIAC:  Heart sounds are regular without murmur or gallop.   IMPRESSION:  1. Allergic rhinitis or possible mild sinusitis.  2. Mild intermittent asthma.   PLAN:  She may resume giving her own allergy shots at home with family  present.  She has EpiPen and understands its use.  Schedule return in 6  months, earlier p.r.n.       Clinton D. Maple Hudson, MD, FCCP,  FACP      CDY/MedQ  DD:  06/09/2006  DT:  06/11/2006  Job #:  [id number]   cc:   Lennis P. Darrold Span, M.D.

## 2011-01-24 NOTE — Assessment & Plan Note (Signed)
[facility]                             PULMONARY OFFICE NOTE   ZENITH, KERCHEVAL                     MRN:          [id number]  DATE:09/18/2006                            DOB:          25-Apr-1961    PROBLEM:  1. Asthma.  2. Allergic rhinitis.  3. Left mastectomy.   HISTORY:  She has had increased cough and chest tightness for 2 weeks.  She does not recognize that it began with an obvious cold syndrome and  does not remember sore throat or fever. Cough has been nonproductive but  she has felt tight, light pressure somewhat hoarse, and with coughing  has developed some muscle type ache just under the left scapula. She  gets temporary relief from her metered inhaler. She went to her primary  at [facility]. They did a chest x-ray and she understands that was  negative. They gave her a nebulizer treatment, did what sounds like a  peak flow measurement, told her it was asthma and allergy related. She  has had no problems with her allergy vaccine and no sneezing or itching,  no other acute complaint.   MEDICATIONS:  1. Fosamax.  2. Calcium.  3. Vitamin C.  4. Wellbutrin 150 mg.  5. Allergy vaccine.  6. Flexeril.  7. Xanax.  8. Albuterol inhaler.  9. EpiPen.   NO MEDICAL ALLERGY.   OBJECTIVE:  Weight 160 pounds, blood pressure 106/62, pulse regular 105,  room air saturation 96%. She is hoarse without stridor. No postnasal  drainage seen. No adenopathy.  Breath sounds are diminished. There are no rhonchi or dullness. Work or  breathing is not increased. No definite wheeze heard with quiet  breathing.  HEART SOUNDS: Regular without murmur.  Neck veins are not distended.  There is no peripheral edema. She does not seem in acute distress.   IMPRESSION:  Exacerbation of asthma. This was mostly likely viral  triggered.   PLAN:  Predinsone 8 day taper with steroid talk. Keep scheduled  appointment and we will consider vaccine dosing at that  time. She is  cautioned to call me if symptoms fail to rapidly improve or if there are  changes that concern her.     Clinton D. Maple Hudson, MD, Tonny Bollman, FACP  Electronically Signed    CDY/MedQ  DD: 09/18/2006  DT: 09/18/2006  Job #: [id number]

## 2011-01-24 NOTE — Assessment & Plan Note (Signed)
[facility]                             PULMONARY OFFICE NOTE   AYARI, LIWANAG                     MRN:          [id number]  DATE:11/23/2006                            DOB:          [birth year]    PROBLEM LIST:  1. Asthma.  2. Allergic rhinitis.  3. Left mastectomy.   HISTORY OF PRESENT ILLNESS:  Her primary care physician now is [facility]. Symbicort seemed to relieve some chest congestion last visit. She  has had a little left upper quadrant discomfort for the past 3 to 4  days, now relieved. Unclear if that had anything to do with her  breathing and she feels well today. She continues allergy vaccine at 1  to 50 with no problems and we have reviewed risk and safety issues  again.   MEDICATIONS:  Fosamax once weekly, multivitamins and calcium, Wellbutrin  150 mg, allergy vaccine, Celexa 30 mg, Symbicort 160/4.5, p.r.n. use of  Flexeril, Xanax, albuterol inhaler and available EpiPen.   ALLERGIES:  NO KNOWN DRUG ALLERGIES.   OBJECTIVE:  VITAL SIGNS:  Weight 161 pounds, blood pressure 122/72,  pulse regular at 85, room air saturation 98%.  GENERAL:  She looks comfortable enough.  HEENT:  Eyes, nose, and throat are clear.  LUNGS:  Clear to auscultation and percussion.  HEART:  Sounds regular without murmur.   IMPRESSION:  Stable asthma and rhinitis.   PLAN:  1. We have refilled the Symbicort for her to use p.r.n. at intervals      during exacerbations.  2. With her next vaccine order, we will advance to 1 to 10.  3. Schedule return in 4 months, earlier p.r.n.   NECK:  I do not find adenopathy at the neck or supraclavicular areas.     Clinton D. Maple Hudson, MD, Tonny Bollman, FACP  Electronically Signed    [facility]  DD: 11/28/2006  DT: 11/28/2006  Job #: [id number]

## 2011-02-25 ENCOUNTER — Ambulatory Visit (INDEPENDENT_AMBULATORY_CARE_PROVIDER_SITE_OTHER): Payer: 59

## 2011-02-25 DIAGNOSIS — J309 Allergic rhinitis, unspecified: Secondary | ICD-10-CM

## 2011-04-28 ENCOUNTER — Ambulatory Visit
Admission: RE | Admit: 2011-04-28 | Discharge: 2011-04-28 | Disposition: A | Discharge status: Home / Self Care | Payer: 59 | Source: Ambulatory Visit | Attending: Oncology | Admitting: Oncology

## 2011-04-28 DIAGNOSIS — Z1239 Encounter for other screening for malignant neoplasm of breast: Secondary | ICD-10-CM

## 2011-05-16 ENCOUNTER — Ambulatory Visit (INDEPENDENT_AMBULATORY_CARE_PROVIDER_SITE_OTHER): Payer: 59 | Admitting: Internal Medicine

## 2011-05-16 ENCOUNTER — Encounter: Payer: Self-pay | Admitting: Internal Medicine

## 2011-05-16 VITALS — BP 124/86 | HR 71 | Ht 63.5 in | Wt 152.2 lb

## 2011-05-16 DIAGNOSIS — Z23 Encounter for immunization: Secondary | ICD-10-CM

## 2011-05-16 DIAGNOSIS — J45909 Unspecified asthma, uncomplicated: Secondary | ICD-10-CM

## 2011-05-16 DIAGNOSIS — J309 Allergic rhinitis, unspecified: Secondary | ICD-10-CM

## 2011-05-16 MED ORDER — AZITHROMYCIN 250 MG PO TABS: ORAL_TABLET | ORAL | Status: AC

## 2011-05-16 MED ORDER — PHENYLEPHRINE HCL 1 % NA SOLN
3.0000 [drp] | Freq: Once | NASAL | Status: AC
  Administered 2011-05-16: 3 [drp] via NASAL

## 2011-05-16 MED ORDER — METHYLPREDNISOLONE ACETATE 80 MG/ML IJ SUSP
80.0000 mg | Freq: Once | INTRAMUSCULAR | Status: AC
  Administered 2011-05-16: 80 mg via INTRAMUSCULAR

## 2011-05-16 NOTE — Assessment & Plan Note (Signed)
Acute rhinitis- Viral vs allergic. We will give nasal neb, depo and antibiotic to hold.

## 2011-05-16 NOTE — Progress Notes (Signed)
Subjective:     Patient ID: [name], female   DOB: [birth year], 50 y.o.   MRN: [id number]  Asthma Her past medical history is significant for asthma.   01/21/11- 50 yoF never smoker followed for allergic rhinitis, asthma, complicated by hx breast cancer/ mastectomy.  Last here Jun 27, 2010 Had done well through winter and spring till she noted onset of nasal drainage and some chest tightness while out shopping. No fever, sore throat or discolored sputum. Went to UC 4 days ago. Told to take allegra. Now chest feels well. Head still draining. A little cough, no wheeze. Light headed. Onset with some headache, not now. Has not had problem with her allergy shots still at 1:10.   05/16/11- 50 yoF never smoker followed for allergic rhinitis, asthma, complicated by hx breast cancer/ mastectomy.  Acute visit-"major attack" second this year- nasal congestion, clear mucus. Cough at night. Denies fever, sore throat. Using Allegra-D . Does do saline nasal rinse.   Review of Systems  Constitutional:   No weight loss, night sweats,  , chills, fatigue, lassitude. HEENT:   No headaches,  Difficulty swallowing,  Tooth/dental problems,  Sore throat,  CV:  No chest pain,  Orthopnea, PND, swelling in lower extremities, anasarca, dizziness, palpitations GI  No heartburn, indigestion, abdominal pain, nausea, vomiting, diarrhea, change in bowel habits, loss of appetite Resp: No shortness of breath with exertion or at rest.  No excess mucus, no productive cough,  + non-productive cough,  No coughing up of blood.  No change in color of mucus.  Skin: no rash or lesions. GU: no dysuria, change in color of urine, no urgency or frequency.  No flank pain. MS:  No joint pain or swelling.  No decreased range of motion.  No back pain. Psych:  No change in mood or affect. No depression or anxiety.  No memory loss.     Objective:   Physical Exam  General- Alert, Oriented, Affect-appropriate, Distress- none acute Skin-  rash-none, lesions- none, excoriation- none Lymphadenopathy- none Head- atraumatic            Eyes- Gross vision intact, PERRLA, conjunctivae clear secretions            Ears- Hearing, canals-normal            Nose- thick white mucus,  no-Septal dev, mucus, polyps, erosion, perforation             Throat- Mallampati II , mucosa clear , drainage- none, tonsils- atrophic Neck- flexible , trachea midline, no stridor , thyroid nl, carotid no bruit Chest - symmetrical excursion , unlabored           Heart/CV- RRR , no murmur , no gallop  , no rub, nl s1 s2                           - JVD- none , edema- none, stasis changes- none, varices- none           Lung- clear to P&A, wheeze- none, cough- none , dullness-none, rub- none           Chest wall- mastectomy Abd- tender-no, distended-no, bowel sounds-present, HSM- no Br/ Gen/ Rectal- Not done, not indicated Extrem- cyanosis- none, clubbing, none, atrophy- none, strength- nl Neuro- grossly intact to observation        Assessment:         Plan:

## 2011-05-16 NOTE — Patient Instructions (Addendum)
Neb neo nasal  Depo 80  Flu vax  Change appointment from October, to return in a year unless, needed sooner

## 2011-05-18 NOTE — Assessment & Plan Note (Signed)
Not yet exacerbated with this episode.

## 2011-06-26 ENCOUNTER — Ambulatory Visit: Payer: Self-pay | Admitting: Internal Medicine

## 2011-07-04 ENCOUNTER — Other Ambulatory Visit: Payer: Self-pay | Admitting: Oncology

## 2011-07-04 ENCOUNTER — Telehealth: Payer: Self-pay | Admitting: Oncology

## 2011-07-04 ENCOUNTER — Encounter (HOSPITAL_BASED_OUTPATIENT_CLINIC_OR_DEPARTMENT_OTHER): Payer: 59 | Admitting: Oncology

## 2011-07-04 DIAGNOSIS — C50419 Malignant neoplasm of upper-outer quadrant of unspecified female breast: Secondary | ICD-10-CM

## 2011-07-04 DIAGNOSIS — Z1231 Encounter for screening mammogram for malignant neoplasm of breast: Secondary | ICD-10-CM

## 2011-07-04 DIAGNOSIS — M899 Disorder of bone, unspecified: Secondary | ICD-10-CM

## 2011-07-04 DIAGNOSIS — R000 Tachycardia, unspecified: Secondary | ICD-10-CM

## 2011-07-04 DIAGNOSIS — K5900 Constipation, unspecified: Secondary | ICD-10-CM

## 2011-07-04 DIAGNOSIS — Z853 Personal history of malignant neoplasm of breast: Secondary | ICD-10-CM

## 2011-07-04 LAB — CBC WITH DIFFERENTIAL/PLATELET
BASO%: 0.6 % (ref 0.0–2.0)
Basophils Absolute: 0 10*3/uL (ref 0.0–0.1)
HCT: 38.1 % (ref 34.8–46.6)
HGB: 13.4 g/dL (ref 11.6–15.9)
LYMPH%: 28.7 % (ref 14.0–49.7)
MCH: 33.3 pg (ref 25.1–34.0)
MCHC: 35.2 g/dL (ref 31.5–36.0)
MONO#: 0.4 10*3/uL (ref 0.1–0.9)
NEUT%: 58.5 % (ref 38.4–76.8)
Platelets: 190 10*3/uL (ref 145–400)
WBC: 4 10*3/uL (ref 3.9–10.3)
lymph#: 1.2 10*3/uL (ref 0.9–3.3)

## 2011-07-04 LAB — COMPREHENSIVE METABOLIC PANEL
ALT: 15 U/L (ref 0–35)
BUN: 13 mg/dL (ref 6–23)
CO2: 27 mEq/L (ref 19–32)
Calcium: 9.4 mg/dL (ref 8.4–10.5)
Chloride: 105 mEq/L (ref 96–112)
Creatinine, Ser: 1.06 mg/dL (ref 0.50–1.10)
Total Bilirubin: 0.3 mg/dL (ref 0.3–1.2)

## 2011-07-04 NOTE — Telephone Encounter (Signed)
gv pt appt schedule for oct 2013 + mammo appt for 04/28/2012 @ 10 am @ [facility].

## 2011-07-04 NOTE — Telephone Encounter (Signed)
Copy of 10/25 pof to tiffany for genetics appt.

## 2011-08-25 ENCOUNTER — Ambulatory Visit (INDEPENDENT_AMBULATORY_CARE_PROVIDER_SITE_OTHER): Payer: 59

## 2011-08-25 DIAGNOSIS — J309 Allergic rhinitis, unspecified: Secondary | ICD-10-CM

## 2011-09-17 ENCOUNTER — Encounter: Payer: Self-pay | Admitting: Internal Medicine

## 2011-09-17 ENCOUNTER — Ambulatory Visit (INDEPENDENT_AMBULATORY_CARE_PROVIDER_SITE_OTHER): Payer: 59 | Admitting: Internal Medicine

## 2011-09-17 VITALS — BP 122/78 | HR 106 | Ht 63.5 in | Wt 152.6 lb

## 2011-09-17 DIAGNOSIS — J309 Allergic rhinitis, unspecified: Secondary | ICD-10-CM

## 2011-09-17 DIAGNOSIS — J0100 Acute maxillary sinusitis, unspecified: Secondary | ICD-10-CM

## 2011-09-17 MED ORDER — METHYLPREDNISOLONE ACETATE 80 MG/ML IJ SUSP
80.0000 mg | Freq: Once | INTRAMUSCULAR | Status: AC
  Administered 2011-09-17: 80 mg via INTRAMUSCULAR

## 2011-09-17 MED ORDER — PHENYLEPHRINE HCL 1 % NA SOLN
3.0000 [drp] | Freq: Once | NASAL | Status: AC
  Administered 2011-09-17: 3 [drp] via NASAL

## 2011-09-17 MED ORDER — AMOXICILLIN-POT CLAVULANATE 875-125 MG PO TABS: 1.0000 | ORAL_TABLET | Freq: Two times a day (BID) | ORAL | Status: AC

## 2011-09-17 NOTE — Patient Instructions (Signed)
Neb neo nasal  Depo 80  Script sent for augmentin  Continue the decongestants and saline rinse

## 2011-09-17 NOTE — Progress Notes (Signed)
Patient ID: [name], female   DOB: [birth year], 51 y.o.   MRN: [id number]  Asthma Her past medical history is significant for asthma.   01/21/11- 51 yoF never smoker followed for allergic rhinitis, asthma, complicated by hx breast cancer/ mastectomy.  Last here Jun 27, 2010 Had done well through winter and spring till she noted onset of nasal drainage and some chest tightness while out shopping. No fever, sore throat or discolored sputum. Went to UC 4 days ago. Told to take allegra. Now chest feels well. Head still draining. A little cough, no wheeze. Light headed. Onset with some headache, not now. Has not had problem with her allergy shots still at 1:10.   05/16/11- 51 yoF never smoker followed for allergic rhinitis, asthma, complicated by hx breast cancer/ mastectomy.  Acute visit-"major attack" second this year- nasal congestion, clear mucus. Cough at night. Denies fever, sore throat. Using Allegra-D . Does do saline nasal rinse.   09/17/11- 51 yoF never smoker followed for allergic rhinitis, asthma, complicated by hx breast cancer/ mastectomy.  Acute visit; sinus headache and excessive drainage x 2 months-not seeing any color to the drainage now Frontal headache and maxillary sinus pressure since Thanksgiving. Took amoxicillin then with temporary help. No discharge now. Taking Mucinex D alternating with Allegra-D. Denies any discomfort in her ears or chest.  Review of Systems- see HPI Constitutional:   No weight loss, night sweats,  , chills, fatigue, lassitude. HEENT:   + headaches,  No- Difficulty swallowing,  Tooth/dental problems,  Sore throat,  CV:  No chest pain,  Orthopnea, PND, swelling in lower extremities, anasarca, dizziness, palpitations GI  No heartburn, indigestion, abdominal pain, nausea, vomiting, diarrhea, change in bowel habits, loss of appetite Resp: No shortness of breath with exertion or at rest.  No excess mucus, no productive cough,   non-productive cough,  No  coughing up of blood.  No change in color of mucus.  Skin: no rash or lesions. GU: no dysuria, change in color of urine, no urgency or frequency.  No flank pain. MS:  No joint pain or swelling.  No decreased range of motion.  No back pain. Psych:  No change in mood or affect. No depression or anxiety.  No memory loss.     Objective:   Physical Exam General- Alert, Oriented, Affect-appropriate, Distress- none acute Skin- rash-none, lesions- none, excoriation- none Lymphadenopathy- none Head- atraumatic            Eyes- Gross vision intact, PERRLA, conjunctivae clear secretions            Ears- Hearing, canals-normal            Nose- thick white mucus,  no-Septal dev, mucus, polyps, erosion, perforation             Throat- Mallampati II , mucosa clear , drainage- none, tonsils- atrophic Neck- flexible , trachea midline, no stridor , thyroid nl, carotid no bruit Chest - symmetrical excursion , unlabored           Heart/CV- RRR , no murmur , no gallop  , no rub, nl s1 s2                           - JVD- none , edema- none, stasis changes- none, varices- none           Lung- clear to P&A, wheeze- none, cough- none , dullness-none, rub- none  Chest wall- mastectomy Abd- Br/ Gen/ Rectal- Not done, not indicated Extrem- cyanosis- none, clubbing, none, atrophy- none, strength- nl Neuro- grossly intact to observation

## 2011-09-20 DIAGNOSIS — J0100 Acute maxillary sinusitis, unspecified: Secondary | ICD-10-CM | POA: Insufficient documentation

## 2011-09-20 NOTE — Assessment & Plan Note (Signed)
Responded initially to antibiotics but relapsed. We discussed the roles of decongestants, saline rinse and antibiotics.

## 2011-10-27 ENCOUNTER — Telehealth: Payer: Self-pay | Admitting: Internal Medicine

## 2011-10-27 MED ORDER — PREDNISONE 20 MG PO TABS: 20.0000 mg | ORAL_TABLET | Freq: Every day | ORAL | Status: AC

## 2011-10-27 NOTE — Telephone Encounter (Signed)
Per Cy-try prednisone 20 mg #3 take 1 po daily x 3 days no refills then if no better then OV.

## 2011-10-27 NOTE — Telephone Encounter (Signed)
Patient made aware of prescription and will call for OV if her symptoms do not improve.

## 2011-10-27 NOTE — Telephone Encounter (Signed)
Pt c/o sneezing, runny nose, watery eyes x 2-3 days. She says she has taken Zyrtec and Allegra but has had no relief. She is asking for recs from [facility]. She uses [facility] on [address]. Pls advise.No Known Allergies

## 2011-11-13 ENCOUNTER — Other Ambulatory Visit (HOSPITAL_COMMUNITY)
Admission: RE | Admit: 2011-11-13 | Discharge: 2011-11-13 | Disposition: A | Discharge status: Home / Self Care | Payer: 59 | Source: Ambulatory Visit | Attending: Obstetrics and Gynecology | Admitting: Obstetrics and Gynecology

## 2011-11-13 ENCOUNTER — Other Ambulatory Visit: Payer: Self-pay | Admitting: Obstetrics and Gynecology

## 2011-11-13 DIAGNOSIS — Z01419 Encounter for gynecological examination (general) (routine) without abnormal findings: Secondary | ICD-10-CM | POA: Insufficient documentation

## 2012-02-04 ENCOUNTER — Ambulatory Visit (INDEPENDENT_AMBULATORY_CARE_PROVIDER_SITE_OTHER): Payer: 59

## 2012-02-04 DIAGNOSIS — J309 Allergic rhinitis, unspecified: Secondary | ICD-10-CM

## 2012-04-28 ENCOUNTER — Ambulatory Visit
Admission: RE | Admit: 2012-04-28 | Discharge: 2012-04-28 | Disposition: A | Discharge status: Home / Self Care | Payer: 59 | Source: Ambulatory Visit | Attending: Oncology | Admitting: Oncology

## 2012-04-28 DIAGNOSIS — Z1231 Encounter for screening mammogram for malignant neoplasm of breast: Secondary | ICD-10-CM

## 2012-05-21 ENCOUNTER — Ambulatory Visit (INDEPENDENT_AMBULATORY_CARE_PROVIDER_SITE_OTHER): Payer: 59 | Admitting: Internal Medicine

## 2012-05-21 ENCOUNTER — Encounter: Payer: Self-pay | Admitting: Internal Medicine

## 2012-05-21 VITALS — BP 110/74 | HR 64 | Ht 63.5 in | Wt 140.8 lb

## 2012-05-21 DIAGNOSIS — Z23 Encounter for immunization: Secondary | ICD-10-CM

## 2012-05-21 DIAGNOSIS — J309 Allergic rhinitis, unspecified: Secondary | ICD-10-CM

## 2012-05-21 MED ORDER — AZELASTINE-FLUTICASONE 137-50 MCG/ACT NA SUSP: 2.0000 | Freq: Every day | NASAL | Status: DC

## 2012-05-21 MED ORDER — EPINEPHRINE 0.3 MG/0.3ML IJ DEVI: 0.3000 mg | Freq: Once | INTRAMUSCULAR | Status: AC

## 2012-05-21 MED ORDER — ALBUTEROL SULFATE HFA 108 (90 BASE) MCG/ACT IN AERS: 2.0000 | INHALATION_SPRAY | Freq: Four times a day (QID) | RESPIRATORY_TRACT | Status: DC | PRN

## 2012-05-21 NOTE — Progress Notes (Signed)
Patient ID: [name], female   DOB: 24-Aug-1961, 51 y.o.   MRN: [id number]  Asthma Her past medical history is significant for asthma.   01/21/11- 50 yoF never smoker followed for allergic rhinitis, asthma, complicated by hx breast cancer/ mastectomy.  Last here Jun 27, 2010 Had done well through winter and spring till she noted onset of nasal drainage and some chest tightness while out shopping. No fever, sore throat or discolored sputum. Went to UC 4 days ago. Told to take allegra. Now chest feels well. Head still draining. A little cough, no wheeze. Light headed. Onset with some headache, not now. Has not had problem with her allergy shots still at 1:10.   05/16/11- 50 yoF never smoker followed for allergic rhinitis, asthma, complicated by hx breast cancer/ mastectomy.  Acute visit-"major attack" second this year- nasal congestion, clear mucus. Cough at night. Denies fever, sore throat. Using Allegra-D . Does do saline nasal rinse.   09/17/11- 50 yoF never smoker followed for allergic rhinitis, asthma, complicated by hx breast cancer/ mastectomy.  Acute visit; sinus headache and excessive drainage x 2 months-not seeing any color to the drainage now Frontal headache and maxillary sinus pressure since Thanksgiving. Took amoxicillin then with temporary help. No discharge now. Taking Mucinex D alternating with Allegra-D. Denies any discomfort in her ears or chest.  05/21/12- 51 yoF never smoker followed for allergic rhinitis, asthma, complicated by hx breast cancer/ mastectomy Doing well on vaccine 1:10 GO and doing well with it; slight flare up with allergies few weeks ago but back to normal now. Has not needed inhaler in 2 years. No recent sinus infection. Uses Allegra-D.  Review of Systems- see HPI Constitutional:   No weight loss, night sweats,  , chills, fatigue, lassitude. HEENT:   + headaches,  No- Difficulty swallowing,  Tooth/dental problems,  Sore throat,  CV:  No chest pain,   Orthopnea, PND, swelling in lower extremities, anasarca, dizziness, palpitations GI  No heartburn, indigestion, abdominal pain, nausea, vomiting,  Resp: No shortness of breath with exertion or at rest.  No excess mucus, no productive cough, no-  non-productive cough,  No coughing up of blood.  No change in color of mucus.  Skin: no rash or lesions. GU: . MS:  No joint pain or swelling.   Psych:  No change in mood or affect. No depression or anxiety.  No memory loss.   Objective:   Physical Exam General- Alert, Oriented, Affect-appropriate, Distress- none acute Skin- rash-none, lesions- none, excoriation- none Lymphadenopathy- none Head- atraumatic            Eyes- Gross vision intact, PERRLA, conjunctivae clear secretions            Ears- Hearing, canals-normal            Nose- thick white mucus,  no-Septal dev, mucus, polyps, erosion, perforation             Throat- Mallampati II , mucosa clear , drainage- none, tonsils- atrophic Neck- flexible , trachea midline, no stridor , thyroid nl, carotid no bruit Chest - symmetrical excursion , unlabored           Heart/CV- RRR , no murmur , no gallop  , no rub, nl s1 s2                           - JVD- none , edema- none, stasis changes- none, varices- none  Lung- clear to P&A, wheeze- none, cough- none , dullness-none, rub- none           Chest wall-  L mastectomy Abd- Br/ Gen/ Rectal- Not done, not indicated Extrem- cyanosis- none, clubbing, none, atrophy- none, strength- nl Neuro- grossly intact to observation

## 2012-05-21 NOTE — Patient Instructions (Addendum)
Refill epipen to have in case of severe allergic reaction while you are on allergy shots  Refill rescue albuterol inhaler to leave on hold at the drug store  Sample Dymista nasal spray- 1-2 puffs each nostril once daily at bedtime.  Flu vax  Please call as needed

## 2012-05-30 NOTE — Assessment & Plan Note (Signed)
She continues to do well generally. We talked about ways to manage occasional increased nasal congestion and drainage, comparing Allegra-D to nasal sprays. Discussed allergy vaccine policies and risk/benefit/EpiPen.

## 2012-06-25 ENCOUNTER — Telehealth: Payer: Self-pay | Admitting: Oncology

## 2012-06-25 NOTE — Telephone Encounter (Signed)
Talked to patient gave her appt with ML on 10/25

## 2012-06-28 ENCOUNTER — Telehealth: Payer: Self-pay | Admitting: Oncology

## 2012-06-28 NOTE — Telephone Encounter (Signed)
pt called to r./s her appt from 10/25 to 10/30      aom

## 2012-07-02 ENCOUNTER — Other Ambulatory Visit: Payer: 59 | Admitting: Lab

## 2012-07-02 ENCOUNTER — Ambulatory Visit: Payer: 59 | Admitting: Oncology

## 2012-07-02 ENCOUNTER — Ambulatory Visit: Payer: 59 | Admitting: Physician Assistant

## 2012-07-07 ENCOUNTER — Other Ambulatory Visit (HOSPITAL_BASED_OUTPATIENT_CLINIC_OR_DEPARTMENT_OTHER): Payer: 59 | Admitting: Lab

## 2012-07-07 ENCOUNTER — Telehealth: Payer: Self-pay | Admitting: Oncology

## 2012-07-07 ENCOUNTER — Ambulatory Visit (HOSPITAL_BASED_OUTPATIENT_CLINIC_OR_DEPARTMENT_OTHER): Payer: 59 | Admitting: Lab

## 2012-07-07 ENCOUNTER — Ambulatory Visit (HOSPITAL_BASED_OUTPATIENT_CLINIC_OR_DEPARTMENT_OTHER): Payer: 59 | Admitting: Physician Assistant

## 2012-07-07 ENCOUNTER — Encounter: Payer: Self-pay | Admitting: Physician Assistant

## 2012-07-07 VITALS — BP 107/72 | HR 86 | Temp 98.2°F | Resp 20 | Ht 63.5 in | Wt 132.0 lb

## 2012-07-07 DIAGNOSIS — M899 Disorder of bone, unspecified: Secondary | ICD-10-CM

## 2012-07-07 DIAGNOSIS — M949 Disorder of cartilage, unspecified: Secondary | ICD-10-CM

## 2012-07-07 DIAGNOSIS — Z853 Personal history of malignant neoplasm of breast: Secondary | ICD-10-CM

## 2012-07-07 DIAGNOSIS — Z9010 Acquired absence of unspecified breast and nipple: Secondary | ICD-10-CM

## 2012-07-07 LAB — CBC WITH DIFFERENTIAL/PLATELET
BASO%: 1.2 % (ref 0.0–2.0)
HCT: 39.3 % (ref 34.8–46.6)
LYMPH%: 40.5 % (ref 14.0–49.7)
MCH: 32 pg (ref 25.1–34.0)
MCHC: 33.9 g/dL (ref 31.5–36.0)
MONO#: 0.4 10*3/uL (ref 0.1–0.9)
NEUT%: 40.6 % (ref 38.4–76.8)
Platelets: 204 10*3/uL (ref 145–400)
WBC: 3.3 10*3/uL — ABNORMAL LOW (ref 3.9–10.3)

## 2012-07-07 LAB — COMPREHENSIVE METABOLIC PANEL (CC13)
ALT: 19 U/L (ref 0–55)
BUN: 12 mg/dL (ref 7.0–26.0)
CO2: 25 mEq/L (ref 22–29)
Creatinine: 1 mg/dL (ref 0.6–1.1)
Glucose: 89 mg/dl (ref 70–99)
Total Bilirubin: 0.33 mg/dL (ref 0.20–1.20)

## 2012-07-07 NOTE — Patient Instructions (Addendum)
Continue self breast exams You will be due for your next mammogram in August 2014, please get another digital breast tomosynthesis, as well as a bone density Follow up with Dr. Darrold Span in 1 year

## 2012-07-07 NOTE — Progress Notes (Signed)
No images are attached to the encounter. No scans are attached to the encounter. No scans are attached to the encounter. [facility] OFFICE PROGRESS NOTE  Hollice Espy, MD [address] [place] [address] Other physicians CC: Gerald Leitz M.D. Asencion Partridge met off M.D. and Dr. Vela Prose  DIAGNOSIS: T1 N1 left breast carcinoma diagnosed in 1998 when she was 51 years old and premenopausal.   PRIOR THERAPY: 1.  Status post left mastectomy with one of 9 nodes involved at mastectomy and axillary node dissection 2. Status post adjuvant chemotherapy with Adriamycin, Cytoxan, and Taxol 3. Status post 5 years of tamoxifen discontinued in March of 2004  CURRENT THERAPY: None/observation  INTERVAL HISTORY: [name] 51 y.o. female returns for a scheduled regular yearly visit for followup of her history of T1 N1 left breast carcinoma now 15 years post diagnosis. She had bilateral digital breast, symphysis performed on April 29 2012. There is no mammographic evidence of malignancy and it was recommended that she have screening exam done again in one year. In the interim [name] has done quite well. She has had no hospitalizations, emergency room visits or surgeries. Her medication list has been reviewed and updated. She continues to have issues with chronic constipation and still manages this relatively well with daily MiraLAX. She is currently seeing Dr. Melvyn Neth for her migraine headaches this is now being treated with Topamax, baclofen and ketoprofen. Today she feels well and voices no specific complaints.  MEDICAL HISTORY: Past Medical History  Diagnosis Date  . Unspecified asthma   . Allergic rhinitis, cause unspecified   . Breast cancer     chemo; left    ALLERGIES:   has no known allergies.  MEDICATIONS:  Current Outpatient Prescriptions  Medication Sig Dispense Refill  . b complex vitamins tablet 2 tablets daily.      . baclofen (LIORESAL) 10 MG tablet Take  10 mg by mouth daily.      Marland Kitchen ketoprofen (ORUDIS) 75 MG capsule Take 75 mg by mouth daily as needed.      . magnesium oxide (MAG-OX) 400 MG tablet Take 400 mg by mouth 2 (two) times daily.      . Multiple Vitamin (MULTIVITAMIN) tablet Take 1 tablet by mouth daily.      Marland Kitchen topiramate (TOPAMAX) 25 MG tablet Take 25 mg by mouth daily. 6 tablets by mouth daily      . albuterol (PROAIR HFA) 108 (90 BASE) MCG/ACT inhaler Inhale 2 puffs into the lungs 4 (four) times daily as needed for wheezing or shortness of breath.  1 Inhaler  prn  . ALPRAZolam (XANAX) 0.5 MG tablet Take 0.5 mg by mouth at bedtime as needed.        . Azelastine-Fluticasone (DYMISTA) 137-50 MCG/ACT SUSP Place 2 sprays into both nostrils at bedtime.  1 Bottle  0  . buPROPion (WELLBUTRIN XL) 300 MG 24 hr tablet Take 300 mg by mouth daily.        . Cholecalciferol (VITAMIN D-3) 5000 UNITS TABS Take 1 tablet three times a week      . DULoxetine (CYMBALTA) 30 MG capsule Take 30 mg by mouth daily.        Marland Kitchen EPINEPHrine (EPIPEN) 0.3 mg/0.3 mL DEVI Inject 0.3 mLs (0.3 mg total) into the muscle once. As needed for severe reaction  1 Device  prn    SURGICAL HISTORY:  Past Surgical History  Procedure Date  . Mastectomy     left;  chemo  . Sinus surgery with instatrak     REVIEW OF SYSTEMS:  A comprehensive review of systems was negative.   PHYSICAL EXAMINATION: General appearance: alert, cooperative, appears stated age and no distress Head: Normocephalic, without obvious abnormality, atraumatic Neck: no adenopathy, no carotid bruit, no JVD, supple, symmetrical, trachea midline and thyroid not enlarged, symmetric, no tenderness/mass/nodules Lymph nodes: Cervical, supraclavicular, and axillary nodes normal. Resp: clear to auscultation bilaterally Back: symmetric, no curvature. ROM normal. No CVA tenderness. Cardio: regular rate and rhythm, S1, S2 normal, no murmur, click, rub or gallop GI: soft, non-tender; bowel sounds normal; no masses,   no organomegaly Extremities: extremities normal, atraumatic, no cyanosis or edema Neurologic: Alert and oriented X 3, normal strength and tone. Normal symmetric reflexes. Normal coordination and gait Breasts: The left TRAM reconstruction is without evidence of local occurrence and there is nothing palpable in the left axilla. There is no left upper extremity edema. The right breast is without skin changes, nipple discharge or masses. They're no findings of concern nothing palpable in the right axilla and no right upper extremity edema  ECOG PERFORMANCE STATUS: 0 - Asymptomatic  Blood pressure 107/72, pulse 86, temperature 98.2 F (36.8 C), temperature source Oral, resp. rate 20, height 5' 3.5" (1.613 m), weight 132 lb (59.875 kg).  LABORATORY DATA: Lab Results  Component Value Date   WBC 3.3* 07/07/2012   HGB 13.3 07/07/2012   HCT 39.3 07/07/2012   MCV 94.3 07/07/2012   PLT 204 07/07/2012      Chemistry      Component Value Date/Time   NA 140 07/04/2011 0937   K 4.2 07/04/2011 0937   CL 105 07/04/2011 0937   CO2 27 07/04/2011 0937   BUN 13 07/04/2011 0937   CREATININE 1.06 07/04/2011 0937      Component Value Date/Time   CALCIUM 9.4 07/04/2011 0937   ALKPHOS 84 07/04/2011 0937   AST 15 07/04/2011 0937   ALT 15 07/04/2011 0937   BILITOT 0.3 07/04/2011 0937       RADIOGRAPHIC STUDIES:  No results found.   ASSESSMENT/PLAN: Patient is a very pleasant 51 year old white female with history of T1 N1 left breast cancer status post left mastectomy with one of 9 nodes involved at mastectomy and axillary node dissection status post TRAM reconstruction. She status post adjuvant chemotherapy with Adriamycin, Cytoxan and Taxol followed by 5 years of tamoxifen through March 2004. She's been on observation since that time. She recently had the bilateral digital breast, symphysis which was read as no mammographic evidence of malignancy with advice to return in one year for a another  screening study. Patient was discussed with Dr. Darrold Span. She will be due for screening digital breast, symphysis in one year as well as of bone density in one year. She is a history of osteopenia however her calcium intake is limited by her problems with chronic constipation. The chronic constipation is well manage with daily MiraLAX. We'll have her stop by the lab today for a CBC differential and C. met. We'll plan to have her followup with Dr. Darrold Span in one year also with a CBC differential and C. met and to review her mammogram and bone density studies.     [name], Virginia Wegener E, PA-C     All questions were answered. The patient knows to call the clinic with any problems, questions or concerns. We can certainly see the patient much sooner if necessary.  I spent 20 minutes counseling the patient face to face.  The total time spent in the appointment was 30 minutes.

## 2012-07-07 NOTE — Telephone Encounter (Signed)
[facility] pt appt for mammogram and Bone density on August 2014 then see MD on October 2014 with labs

## 2012-07-20 ENCOUNTER — Ambulatory Visit (INDEPENDENT_AMBULATORY_CARE_PROVIDER_SITE_OTHER): Payer: 59

## 2012-07-20 DIAGNOSIS — J309 Allergic rhinitis, unspecified: Secondary | ICD-10-CM

## 2012-07-21 ENCOUNTER — Telehealth: Payer: Self-pay

## 2012-07-21 NOTE — Telephone Encounter (Deleted)
Message copied by Lorine Bears on Wed Jul 21, 2012  3:26 PM ------      Message from: Jama Flavors P      Created: Wed Jul 21, 2012  3:17 PM       Please let patient know that I reviewed her blood work from recent visit with midlevel here. Her WBC is a little low, and I am not sure why, so I would like to repeat CBC in ~ 2-4 weeks from now. Sometimes this is down if you have had a viral infection recently, rarely topomax can do it, and other reasons. I am glad to see her with the cbc, or we can just get the lab and call her about it; I should see her if any problems (but she seemed to be doing well per recent midlevel visit).

## 2012-07-21 NOTE — Telephone Encounter (Signed)
Message copied by Lorine Bears on Wed Jul 21, 2012  3:23 PM ------      Message from: Jama Flavors P      Created: Wed Jul 21, 2012  3:17 PM       Please let patient know that I reviewed her blood work from recent visit with midlevel here. Her WBC is a little low, and I am not sure why, so I would like to repeat CBC in ~ 2-4 weeks from now. Sometimes this is down if you have had a viral infection recently, rarely topomax can do it, and other reasons. I am glad to see her with the cbc, or we can just get the lab and call her about it; I should see her if any problems (but she seemed to be doing well per recent midlevel visit).

## 2012-07-21 NOTE — Telephone Encounter (Signed)
Spoke with [name]  About lab work as noted below by Dr. Darrold Span.  [name] is fine with getting labs only in 2-4 weeks.  Sent a pof to schedulers to set up an appointment.

## 2012-07-22 ENCOUNTER — Telehealth: Payer: Self-pay | Admitting: *Deleted

## 2012-07-22 NOTE — Telephone Encounter (Signed)
Per orders from 07-21-2012 left voice message to inform the patient of the new date and time of the lab only appointment

## 2012-07-28 ENCOUNTER — Telehealth: Payer: Self-pay | Admitting: *Deleted

## 2012-07-28 ENCOUNTER — Other Ambulatory Visit: Payer: Self-pay | Admitting: Oncology

## 2012-07-28 ENCOUNTER — Other Ambulatory Visit (HOSPITAL_BASED_OUTPATIENT_CLINIC_OR_DEPARTMENT_OTHER): Payer: 59 | Admitting: Lab

## 2012-07-28 DIAGNOSIS — C50919 Malignant neoplasm of unspecified site of unspecified female breast: Secondary | ICD-10-CM

## 2012-07-28 LAB — CBC WITH DIFFERENTIAL/PLATELET
Basophils Absolute: 0 10*3/uL (ref 0.0–0.1)
EOS%: 3.8 % (ref 0.0–7.0)
Eosinophils Absolute: 0.1 10*3/uL (ref 0.0–0.5)
HGB: 12 g/dL (ref 11.6–15.9)
MCH: 31.1 pg (ref 25.1–34.0)
MONO#: 0.4 10*3/uL (ref 0.1–0.9)
NEUT#: 1.6 10*3/uL (ref 1.5–6.5)
RDW: 13.1 % (ref 11.2–14.5)
WBC: 3.4 10*3/uL — ABNORMAL LOW (ref 3.9–10.3)
lymph#: 1.3 10*3/uL (ref 0.9–3.3)

## 2012-07-28 NOTE — Telephone Encounter (Signed)
Called per Dr Darrold Span note attached. Pt would like CBC done here at [facility]. Also she has requested her PCP to send Dr Cleophas Dunker her kidney function results to review.

## 2012-07-28 NOTE — Telephone Encounter (Signed)
Message copied by Phillis Knack on Wed Jul 28, 2012 12:18 PM ------      Message from: Jama Flavors P      Created: Wed Jul 28, 2012 11:32 AM       Labs seen and need follow up: please let her know WBC better today, tho still just a little lower than her usual, and other counts ok. If ok with her, I would like repeat CBC here or by PCP in ~ 3 months. If done here, RN please be sure we communicate results to patient as next MD visit not for a year. Orders for CBC need to be entered if to be done here.  thanks

## 2012-07-30 ENCOUNTER — Telehealth: Payer: Self-pay | Admitting: Oncology

## 2012-07-30 NOTE — Telephone Encounter (Signed)
lvm for pt regarding 02.17.14 lab....mailed pt Feb 2014 appt schedule.

## 2012-10-25 ENCOUNTER — Telehealth: Payer: Self-pay

## 2012-10-25 ENCOUNTER — Other Ambulatory Visit (HOSPITAL_BASED_OUTPATIENT_CLINIC_OR_DEPARTMENT_OTHER): Payer: BC Managed Care – PPO | Admitting: Lab

## 2012-10-25 DIAGNOSIS — C50919 Malignant neoplasm of unspecified site of unspecified female breast: Secondary | ICD-10-CM

## 2012-10-25 LAB — CBC WITH DIFFERENTIAL/PLATELET
Basophils Absolute: 0 10*3/uL (ref 0.0–0.1)
Eosinophils Absolute: 0.2 10*3/uL (ref 0.0–0.5)
HGB: 13.2 g/dL (ref 11.6–15.9)
MCV: 92.7 fL (ref 79.5–101.0)
MONO%: 10.2 % (ref 0.0–14.0)
NEUT#: 3 10*3/uL (ref 1.5–6.5)
RDW: 12.8 % (ref 11.2–14.5)

## 2012-10-25 NOTE — Telephone Encounter (Signed)
Message copied by Lorine Bears on Mon Oct 25, 2012  4:21 PM ------      Message from: Jama Flavors P      Created: Mon Oct 25, 2012  1:59 PM       Labs seen and need follow up: please let her know these blood counts are fine, including WBC 5.2, ANC 3.0, Hgb 13.2 and plt 207k. Keep appointment as scheduled (MD yearly). Please send copy of this CBC to PCP also. ------

## 2012-10-25 NOTE — Telephone Encounter (Signed)
Discussed labs from 10-25-12 as noted below from Dr. Darrold Span.   Electronically sent a copy of labs to PCP Dr. [name].

## 2012-11-05 ENCOUNTER — Telehealth: Payer: Self-pay | Admitting: Internal Medicine

## 2012-11-05 MED ORDER — AZELASTINE-FLUTICASONE 137-50 MCG/ACT NA SUSP: 2.0000 | Freq: Every day | NASAL | Status: DC

## 2012-11-05 NOTE — Telephone Encounter (Signed)
RX has been sent for pt. Pt aware and needed nothing further

## 2012-11-17 ENCOUNTER — Other Ambulatory Visit (HOSPITAL_COMMUNITY)
Admission: RE | Admit: 2012-11-17 | Discharge: 2012-11-17 | Disposition: A | Discharge status: Home / Self Care | Payer: BC Managed Care – PPO | Source: Ambulatory Visit | Attending: Obstetrics and Gynecology | Admitting: Obstetrics and Gynecology

## 2012-11-17 ENCOUNTER — Other Ambulatory Visit: Payer: Self-pay | Admitting: Obstetrics and Gynecology

## 2012-11-17 DIAGNOSIS — Z01419 Encounter for gynecological examination (general) (routine) without abnormal findings: Secondary | ICD-10-CM | POA: Insufficient documentation

## 2012-11-17 DIAGNOSIS — Z1151 Encounter for screening for human papillomavirus (HPV): Secondary | ICD-10-CM | POA: Insufficient documentation

## 2012-11-19 ENCOUNTER — Telehealth: Payer: Self-pay | Admitting: Internal Medicine

## 2012-11-19 MED ORDER — FLUTICASONE PROPIONATE 50 MCG/ACT NA SUSP: NASAL | Status: DC

## 2012-11-19 MED ORDER — AZELASTINE HCL 0.1 % NA SOLN: NASAL | Status: DC

## 2012-11-19 NOTE — Telephone Encounter (Signed)
Per [facility] Flonase 1-2 sprays in each nostril QHS #1 with prn refills as well as Astelin 1-2 spray in each nostril QHS #1 with prn refills.

## 2012-11-19 NOTE — Telephone Encounter (Signed)
i spoke with pt and is aware of the 2 nasal sprays being called in for her. rx has been sent. Nothing further was needed

## 2012-11-19 NOTE — Telephone Encounter (Signed)
Dr. Maple Hudson can we call in the 2 separate nasal sprays or would you like for [place] to initiate PA for the dymista? Please advise thanks

## 2013-02-21 ENCOUNTER — Ambulatory Visit (INDEPENDENT_AMBULATORY_CARE_PROVIDER_SITE_OTHER): Payer: BC Managed Care – PPO

## 2013-02-21 DIAGNOSIS — J309 Allergic rhinitis, unspecified: Secondary | ICD-10-CM

## 2013-03-22 ENCOUNTER — Telehealth: Payer: Self-pay | Admitting: Hematology and Oncology

## 2013-03-22 NOTE — Telephone Encounter (Signed)
LMONVM ADVISING THE PT OF HER OCT 2014 APPTS WITH DR SALEM@9 :30AM

## 2013-03-27 ENCOUNTER — Encounter (HOSPITAL_COMMUNITY): Payer: Self-pay | Admitting: Certified Registered"

## 2013-03-27 ENCOUNTER — Emergency Department (HOSPITAL_BASED_OUTPATIENT_CLINIC_OR_DEPARTMENT_OTHER): Payer: BC Managed Care – PPO

## 2013-03-27 ENCOUNTER — Emergency Department (HOSPITAL_COMMUNITY): Payer: BC Managed Care – PPO | Admitting: Certified Registered"

## 2013-03-27 ENCOUNTER — Other Ambulatory Visit (INDEPENDENT_AMBULATORY_CARE_PROVIDER_SITE_OTHER): Payer: Self-pay | Admitting: General Surgery

## 2013-03-27 ENCOUNTER — Encounter (HOSPITAL_BASED_OUTPATIENT_CLINIC_OR_DEPARTMENT_OTHER): Payer: Self-pay | Admitting: *Deleted

## 2013-03-27 ENCOUNTER — Observation Stay (HOSPITAL_BASED_OUTPATIENT_CLINIC_OR_DEPARTMENT_OTHER)
Admission: EM | Admit: 2013-03-27 | Discharge: 2013-03-28 | Disposition: A | Discharge status: Home / Self Care | Payer: BC Managed Care – PPO | Attending: Surgery | Admitting: Surgery

## 2013-03-27 ENCOUNTER — Encounter (HOSPITAL_COMMUNITY)
Admission: EM | Disposition: A | Discharge status: Home / Self Care | Payer: Self-pay | Source: Home / Self Care | Attending: Emergency Medicine

## 2013-03-27 DIAGNOSIS — K3580 Unspecified acute appendicitis: Principal | ICD-10-CM | POA: Insufficient documentation

## 2013-03-27 DIAGNOSIS — Z79899 Other long term (current) drug therapy: Secondary | ICD-10-CM | POA: Insufficient documentation

## 2013-03-27 DIAGNOSIS — K7689 Other specified diseases of liver: Secondary | ICD-10-CM | POA: Insufficient documentation

## 2013-03-27 DIAGNOSIS — Z853 Personal history of malignant neoplasm of breast: Secondary | ICD-10-CM | POA: Insufficient documentation

## 2013-03-27 DIAGNOSIS — K37 Unspecified appendicitis: Secondary | ICD-10-CM

## 2013-03-27 HISTORY — PX: LAPAROSCOPIC APPENDECTOMY: SHX408

## 2013-03-27 LAB — CBC WITH DIFFERENTIAL/PLATELET
Basophils Absolute: 0 10*3/uL (ref 0.0–0.1)
Eosinophils Absolute: 0.1 10*3/uL (ref 0.0–0.7)
Eosinophils Relative: 1 % (ref 0–5)
MCH: 32.4 pg (ref 26.0–34.0)
MCV: 95.6 fL (ref 78.0–100.0)
Neutrophils Relative %: 86 % — ABNORMAL HIGH (ref 43–77)
Platelets: 178 10*3/uL (ref 150–400)
RBC: 4.07 MIL/uL (ref 3.87–5.11)
RDW: 12.3 % (ref 11.5–15.5)
WBC: 10.5 10*3/uL (ref 4.0–10.5)

## 2013-03-27 LAB — COMPREHENSIVE METABOLIC PANEL
ALT: 18 U/L (ref 0–35)
AST: 18 U/L (ref 0–37)
Albumin: 4 g/dL (ref 3.5–5.2)
Alkaline Phosphatase: 87 U/L (ref 39–117)
Calcium: 9.9 mg/dL (ref 8.4–10.5)
Potassium: 3.7 mEq/L (ref 3.5–5.1)
Sodium: 138 mEq/L (ref 135–145)
Total Protein: 6.9 g/dL (ref 6.0–8.3)

## 2013-03-27 LAB — URINALYSIS, ROUTINE W REFLEX MICROSCOPIC
Bilirubin Urine: NEGATIVE
Leukocytes, UA: NEGATIVE
Nitrite: NEGATIVE
Specific Gravity, Urine: 1.016 (ref 1.005–1.030)
Urobilinogen, UA: 0.2 mg/dL (ref 0.0–1.0)
pH: 6 (ref 5.0–8.0)

## 2013-03-27 SURGERY — APPENDECTOMY, LAPAROSCOPIC
Anesthesia: General | Wound class: Contaminated

## 2013-03-27 MED ORDER — ENOXAPARIN SODIUM 40 MG/0.4ML ~~LOC~~ SOLN
40.0000 mg | SUBCUTANEOUS | Status: DC
  Filled 2013-03-27: qty 0.4

## 2013-03-27 MED ORDER — ONDANSETRON HCL 4 MG/2ML IJ SOLN
INTRAMUSCULAR | Status: DC | PRN
  Administered 2013-03-27: 4 mg via INTRAVENOUS

## 2013-03-27 MED ORDER — MORPHINE SULFATE 4 MG/ML IJ SOLN
4.0000 mg | Freq: Once | INTRAMUSCULAR | Status: AC
  Administered 2013-03-27: 4 mg via INTRAVENOUS
  Filled 2013-03-27: qty 1

## 2013-03-27 MED ORDER — ROCURONIUM BROMIDE 100 MG/10ML IV SOLN
INTRAVENOUS | Status: DC | PRN
  Administered 2013-03-27: 30 mg via INTRAVENOUS

## 2013-03-27 MED ORDER — KETOROLAC TROMETHAMINE 30 MG/ML IJ SOLN: 15.0000 mg | Freq: Once | INTRAMUSCULAR | Status: DC | PRN

## 2013-03-27 MED ORDER — FENTANYL CITRATE 0.05 MG/ML IJ SOLN
INTRAMUSCULAR | Status: DC | PRN
  Administered 2013-03-27 (×3): 50 ug via INTRAVENOUS
  Administered 2013-03-27: 100 ug via INTRAVENOUS

## 2013-03-27 MED ORDER — LACTATED RINGERS IR SOLN
Status: DC | PRN
  Administered 2013-03-27: 1000 mL

## 2013-03-27 MED ORDER — ALBUTEROL SULFATE HFA 108 (90 BASE) MCG/ACT IN AERS
2.0000 | INHALATION_SPRAY | Freq: Four times a day (QID) | RESPIRATORY_TRACT | Status: DC | PRN
  Filled 2013-03-27: qty 6.7

## 2013-03-27 MED ORDER — GLYCOPYRROLATE 0.2 MG/ML IJ SOLN
INTRAMUSCULAR | Status: DC | PRN
  Administered 2013-03-27: .5 mg via INTRAVENOUS

## 2013-03-27 MED ORDER — NEOSTIGMINE METHYLSULFATE 1 MG/ML IJ SOLN
INTRAMUSCULAR | Status: DC | PRN
  Administered 2013-03-27: 3 mg via INTRAVENOUS

## 2013-03-27 MED ORDER — SODIUM CHLORIDE 0.9 % IV SOLN
1.0000 g | INTRAVENOUS | Status: AC
  Administered 2013-03-27: 1 g via INTRAVENOUS

## 2013-03-27 MED ORDER — DEXAMETHASONE SODIUM PHOSPHATE 10 MG/ML IJ SOLN
INTRAMUSCULAR | Status: DC | PRN
  Administered 2013-03-27: 10 mg via INTRAVENOUS

## 2013-03-27 MED ORDER — BUPIVACAINE-EPINEPHRINE 0.25% -1:200000 IJ SOLN
INTRAMUSCULAR | Status: DC | PRN
  Administered 2013-03-27: 30 mL

## 2013-03-27 MED ORDER — ALPRAZOLAM 0.5 MG PO TABS: 0.5000 mg | ORAL_TABLET | Freq: Every evening | ORAL | Status: DC | PRN

## 2013-03-27 MED ORDER — IOHEXOL 300 MG/ML  SOLN
50.0000 mL | Freq: Once | INTRAMUSCULAR | Status: AC | PRN
  Administered 2013-03-27: 50 mL via ORAL

## 2013-03-27 MED ORDER — METOCLOPRAMIDE HCL 5 MG/ML IJ SOLN
INTRAMUSCULAR | Status: DC | PRN
  Administered 2013-03-27: 10 mg via INTRAVENOUS

## 2013-03-27 MED ORDER — SODIUM CHLORIDE 0.9 % IV SOLN
INTRAVENOUS | Status: DC
  Administered 2013-03-28: 04:00:00 via INTRAVENOUS

## 2013-03-27 MED ORDER — BACLOFEN 10 MG PO TABS
10.0000 mg | ORAL_TABLET | Freq: Every day | ORAL | Status: DC
  Administered 2013-03-28: 10 mg via ORAL
  Filled 2013-03-27: qty 1

## 2013-03-27 MED ORDER — LACTATED RINGERS IV SOLN
INTRAVENOUS | Status: DC | PRN
  Administered 2013-03-27: 22:00:00 via INTRAVENOUS

## 2013-03-27 MED ORDER — PROPOFOL 10 MG/ML IV BOLUS
INTRAVENOUS | Status: DC | PRN
  Administered 2013-03-27: 130 mg via INTRAVENOUS

## 2013-03-27 MED ORDER — LACTATED RINGERS IV SOLN: INTRAVENOUS | Status: DC

## 2013-03-27 MED ORDER — BUPROPION HCL ER (XL) 300 MG PO TB24
300.0000 mg | ORAL_TABLET | Freq: Every day | ORAL | Status: DC
  Administered 2013-03-28: 300 mg via ORAL
  Filled 2013-03-27: qty 1

## 2013-03-27 MED ORDER — ONDANSETRON HCL 4 MG/2ML IJ SOLN
4.0000 mg | Freq: Once | INTRAMUSCULAR | Status: AC
  Administered 2013-03-27: 4 mg via INTRAVENOUS
  Filled 2013-03-27: qty 2

## 2013-03-27 MED ORDER — PROMETHAZINE HCL 25 MG/ML IJ SOLN: 6.2500 mg | INTRAMUSCULAR | Status: DC | PRN

## 2013-03-27 MED ORDER — 0.9 % SODIUM CHLORIDE (POUR BTL) OPTIME
TOPICAL | Status: DC | PRN
  Administered 2013-03-27: 1000 mL

## 2013-03-27 MED ORDER — BUPIVACAINE-EPINEPHRINE PF 0.25-1:200000 % IJ SOLN
INTRAMUSCULAR | Status: AC
Start: 2013-03-27 — End: 2013-03-27
  Filled 2013-03-27: qty 30

## 2013-03-27 MED ORDER — OXYCODONE-ACETAMINOPHEN 5-325 MG PO TABS
1.0000 | ORAL_TABLET | ORAL | Status: DC | PRN
  Administered 2013-03-28 (×2): 2 via ORAL
  Filled 2013-03-27 (×2): qty 2

## 2013-03-27 MED ORDER — METHOCARBAMOL 500 MG PO TABS
500.0000 mg | ORAL_TABLET | ORAL | Status: DC
  Filled 2013-03-27: qty 1

## 2013-03-27 MED ORDER — SUCCINYLCHOLINE CHLORIDE 20 MG/ML IJ SOLN
INTRAMUSCULAR | Status: DC | PRN
  Administered 2013-03-27: 80 mg via INTRAVENOUS

## 2013-03-27 MED ORDER — DULOXETINE HCL 30 MG PO CPEP
30.0000 mg | ORAL_CAPSULE | Freq: Every day | ORAL | Status: DC
  Administered 2013-03-28: 30 mg via ORAL
  Filled 2013-03-27: qty 1

## 2013-03-27 MED ORDER — ONDANSETRON HCL 4 MG PO TABS: 4.0000 mg | ORAL_TABLET | Freq: Four times a day (QID) | ORAL | Status: DC | PRN

## 2013-03-27 MED ORDER — MORPHINE SULFATE 2 MG/ML IJ SOLN
2.0000 mg | INTRAMUSCULAR | Status: DC | PRN
  Administered 2013-03-28 (×2): 2 mg via INTRAVENOUS
  Filled 2013-03-27 (×2): qty 1

## 2013-03-27 MED ORDER — ONDANSETRON HCL 4 MG/2ML IJ SOLN: 4.0000 mg | Freq: Four times a day (QID) | INTRAMUSCULAR | Status: DC | PRN

## 2013-03-27 MED ORDER — MIDAZOLAM HCL 5 MG/5ML IJ SOLN
INTRAMUSCULAR | Status: DC | PRN
  Administered 2013-03-27: 2 mg via INTRAVENOUS

## 2013-03-27 MED ORDER — HYDROMORPHONE HCL PF 1 MG/ML IJ SOLN: 0.2500 mg | INTRAMUSCULAR | Status: DC | PRN

## 2013-03-27 MED ORDER — LIDOCAINE HCL 1 % IJ SOLN
INTRAMUSCULAR | Status: DC | PRN
  Administered 2013-03-27: 20 mg via INTRADERMAL

## 2013-03-27 MED ORDER — TOPIRAMATE 25 MG PO TABS
150.0000 mg | ORAL_TABLET | Freq: Every day | ORAL | Status: DC
Start: 2013-03-28 — End: 2013-03-28
  Filled 2013-03-27: qty 6

## 2013-03-27 MED ORDER — IOHEXOL 300 MG/ML  SOLN
100.0000 mL | Freq: Once | INTRAMUSCULAR | Status: AC | PRN
  Administered 2013-03-27: 100 mL via INTRAVENOUS

## 2013-03-27 SURGICAL SUPPLY — 39 items
APPLIER CLIP 5 13 M/L LIGAMAX5 (MISCELLANEOUS)
APPLIER CLIP ROT 10 11.4 M/L (STAPLE)
CANISTER SUCTION 2500CC (MISCELLANEOUS) ×2 IMPLANT
CHLORAPREP W/TINT 26ML (MISCELLANEOUS) ×2 IMPLANT
CLIP APPLIE 5 13 M/L LIGAMAX5 (MISCELLANEOUS) IMPLANT
CLIP APPLIE ROT 10 11.4 M/L (STAPLE) IMPLANT
CLOTH BEACON ORANGE TIMEOUT ST (SAFETY) ×2 IMPLANT
CORD HIGH FREQUENCY UNIPOLAR (ELECTROSURGICAL) ×2 IMPLANT
CUTTER FLEX LINEAR 45M (STAPLE) ×2 IMPLANT
DECANTER SPIKE VIAL GLASS SM (MISCELLANEOUS) ×2 IMPLANT
DERMABOND ADVANCED (GAUZE/BANDAGES/DRESSINGS) ×1
DERMABOND ADVANCED .7 DNX12 (GAUZE/BANDAGES/DRESSINGS) ×1 IMPLANT
DRAPE LAPAROSCOPIC ABDOMINAL (DRAPES) ×2 IMPLANT
DRAPE UTILITY W/TAPE 26X15 (DRAPES) ×2 IMPLANT
DRAPE UTILITY XL STRL (DRAPES) ×2 IMPLANT
ELECT REM PT RETURN 9FT ADLT (ELECTROSURGICAL) ×2
ELECTRODE REM PT RTRN 9FT ADLT (ELECTROSURGICAL) ×1 IMPLANT
GLOVE BIO SURGEON STRL SZ 6.5 (GLOVE) ×2 IMPLANT
GLOVE BIOGEL PI IND STRL 7.0 (GLOVE) ×1 IMPLANT
GLOVE BIOGEL PI INDICATOR 7.0 (GLOVE) ×1
GOWN STRL REIN 2XL XLG LVL4 (GOWN DISPOSABLE) ×2 IMPLANT
GOWN STRL REIN XL XLG (GOWN DISPOSABLE) ×4 IMPLANT
IV LACTATED RINGERS 1000ML (IV SOLUTION) ×2 IMPLANT
KIT BASIN OR (CUSTOM PROCEDURE TRAY) ×2 IMPLANT
POUCH SPECIMEN RETRIEVAL 10MM (ENDOMECHANICALS) ×2 IMPLANT
RELOAD 45 VASCULAR/THIN (ENDOMECHANICALS) ×2 IMPLANT
RELOAD STAPLE TA45 3.5 REG BLU (ENDOMECHANICALS) ×4 IMPLANT
SCALPEL HARMONIC ACE (MISCELLANEOUS) IMPLANT
SET IRRIG TUBING LAPAROSCOPIC (IRRIGATION / IRRIGATOR) ×2 IMPLANT
SOLUTION ANTI FOG 6CC (MISCELLANEOUS) ×2 IMPLANT
SUT MNCRL AB 4-0 PS2 18 (SUTURE) ×2 IMPLANT
SUT VIC AB 4-0 PS2 27 (SUTURE) ×2 IMPLANT
TOWEL OR 17X26 10 PK STRL BLUE (TOWEL DISPOSABLE) ×2 IMPLANT
TRAY FOLEY CATH 14FRSI W/METER (CATHETERS) ×2 IMPLANT
TRAY LAP CHOLE (CUSTOM PROCEDURE TRAY) ×2 IMPLANT
TROCAR BLADELESS OPT 5 75 (ENDOMECHANICALS) ×4 IMPLANT
TROCAR XCEL 12X100 BLDLESS (ENDOMECHANICALS) IMPLANT
TROCAR XCEL BLUNT TIP 100MML (ENDOMECHANICALS) ×2 IMPLANT
TUBING INSUFFLATION 10FT LAP (TUBING) ×2 IMPLANT

## 2013-03-27 NOTE — Anesthesia Preprocedure Evaluation (Signed)
Anesthesia Evaluation  Patient identified by MRN, date of birth, ID band Patient awake    Reviewed: Allergy & Precautions, H&P , NPO status , Patient's Chart, lab work & pertinent test results  Airway Mallampati: II TM Distance: >3 FB Neck ROM: Full    Dental no notable dental hx.    Pulmonary asthma ,  breath sounds clear to auscultation  Pulmonary exam normal       Cardiovascular negative cardio ROS  Rhythm:Regular Rate:Normal     Neuro/Psych negative neurological ROS  negative psych ROS   GI/Hepatic negative GI ROS, Neg liver ROS,   Endo/Other  negative endocrine ROS  Renal/GU negative Renal ROS  negative genitourinary   Musculoskeletal negative musculoskeletal ROS (+)   Abdominal   Peds negative pediatric ROS (+)  Hematology negative hematology ROS (+)   Anesthesia Other Findings   Reproductive/Obstetrics negative OB ROS                           Anesthesia Physical Anesthesia Plan  ASA: II and emergent  Anesthesia Plan: General   Post-op Pain Management:    Induction: Intravenous and Rapid sequence  Airway Management Planned: Oral ETT  Additional Equipment:   Intra-op Plan:   Post-operative Plan: Extubation in OR  Informed Consent: I have reviewed the patients History and Physical, chart, labs and discussed the procedure including the risks, benefits and alternatives for the proposed anesthesia with the patient or authorized representative who has indicated his/her understanding and acceptance.   Dental advisory given  Plan Discussed with: CRNA and Surgeon  Anesthesia Plan Comments:         Anesthesia Quick Evaluation

## 2013-03-27 NOTE — ED Provider Notes (Signed)
History    This chart was scribed for Virginia Sprout, MD by Quintella Reichert, ED scribe.  This patient was seen in room MH03/MH03 and the patient's care was started at 5:26 PM.  CSN: [id number]  Arrival date & time 03/27/13  1622    Chief Complaint  Patient presents with  . Abdominal Pain    The history is provided by the patient. No language interpreter was used.     HPI Comments: [name] is a 52 y.o. female with h/o CKD who presents to the Emergency Department complaining of constant, moderate-to-severe, non-radiating right lower quadrant abdominal pain.  Pt reports that she felt fine last night and this morning prior to onset of pain.  Her pain came on suddenly 6 hours ago while she was sitting at church, and gradually became more severe over the next several hours.  She states that when her pain was most severe "I couldn't even stand up straight."  Presently pain has improved somewhat.  She has not taken pain medications pta.  Pt denies nausea, emesis, dysuria, fever, or diarrhea.  She notes that she has h/o constipation and took 2 bowel stimulants after pain began but this did not induce a BM.   Her last BM was yesterday and was normal, and she notes she has been moving her bowels regularly for the past week.  She states she does not feel constipated.   She denies h/o kidney stones.  She denies h/o abdominal surgeries.   Past Medical History  Diagnosis Date  . Unspecified asthma(493.90)   . Allergic rhinitis, cause unspecified   . Breast cancer     chemo; left    Past Surgical History  Procedure Laterality Date  . Mastectomy      left; chemo  . Sinus surgery with instatrak      Family History  Problem Relation Age of Onset  . Other Father     Lambert-Eaton neuromuscular degenerative disease    History  Substance Use Topics  . Smoking status: Never Smoker   . Smokeless tobacco: Not on file  . Alcohol Use: No    OB History   Grav Para Term Preterm  Abortions TAB SAB Ect Mult Living                  Review of Systems A complete 10 system review of systems was obtained and all systems are negative except as noted in the HPI and PMH.     Allergies  Review of patient's allergies indicates no known allergies.  Home Medications   Current Outpatient Rx  Name  Route  Sig  Dispense  Refill  . ALPRAZolam (XANAX) 0.5 MG tablet   Oral   Take 0.5 mg by mouth at bedtime as needed.           Marland Kitchen azelastine (ASTELIN) 137 MCG/SPRAY nasal spray      1-2 sprays each nostril at bedtime   30 mL   prn   . Azelastine-Fluticasone (DYMISTA) 137-50 MCG/ACT SUSP   Each Nare   Place 2 sprays into both nostrils at bedtime.   1 Bottle   3   . buPROPion (WELLBUTRIN XL) 300 MG 24 hr tablet   Oral   Take 300 mg by mouth daily.           . Cholecalciferol (VITAMIN D-3) 5000 UNITS TABS      Take 1 tablet three times a week         .  DULoxetine (CYMBALTA) 30 MG capsule   Oral   Take 30 mg by mouth daily.           . fluticasone (FLONASE) 50 MCG/ACT nasal spray      1-2 sprays each nostril at bedtime   16 g   prn   . magnesium oxide (MAG-OX) 400 MG tablet   Oral   Take 400 mg by mouth 2 (two) times daily.         . methocarbamol (ROBAXIN) 500 MG tablet   Oral   Take 500 mg by mouth 1 day or 1 dose.         . Multiple Vitamin (MULTIVITAMIN) tablet   Oral   Take 1 tablet by mouth daily.         . SUMAtriptan (IMITREX) 50 MG tablet   Oral   Take 50 mg by mouth every 2 (two) hours as needed for migraine.         Marland Kitchen albuterol (PROAIR HFA) 108 (90 BASE) MCG/ACT inhaler   Inhalation   Inhale 2 puffs into the lungs 4 (four) times daily as needed for wheezing or shortness of breath.   1 Inhaler   prn   . b complex vitamins tablet      2 tablets daily.         . baclofen (LIORESAL) 10 MG tablet   Oral   Take 10 mg by mouth daily.         Marland Kitchen EPINEPHrine (EPIPEN) 0.3 mg/0.3 mL DEVI   Intramuscular   Inject 0.3  mLs (0.3 mg total) into the muscle once. As needed for severe reaction   1 Device   prn   . ketoprofen (ORUDIS) 75 MG capsule   Oral   Take 75 mg by mouth daily as needed.         . topiramate (TOPAMAX) 25 MG tablet   Oral   Take 25 mg by mouth daily. 6 tablets by mouth daily          BP 113/78  Pulse 114  Temp(Src) 98.5 F (36.9 C) (Oral)  Resp 18  Ht 5\' 4"  (1.626 m)  Wt 135 lb (61.236 kg)  BMI 23.16 kg/m2  SpO2 98%  Physical Exam  Nursing note and vitals reviewed. Constitutional: She is oriented to person, place, and time. She appears well-developed and well-nourished. No distress.  HENT:  Head: Normocephalic and atraumatic.  Mouth/Throat: Oropharynx is clear and moist. No oropharyngeal exudate.  Eyes: Conjunctivae are normal.  Neck: Neck supple. No tracheal deviation present.  Cardiovascular: Normal heart sounds.  Tachycardia present.   No murmur heard. Pulmonary/Chest: Effort normal and breath sounds normal. No respiratory distress. She has no wheezes. She has no rales.  Abdominal: Soft. There is tenderness in the right lower quadrant. There is no rebound, no CVA tenderness and no tenderness at McBurney's point.    Point tenderness along mastectomy reconstruction scar, with guarding but no palpable hernias.  Musculoskeletal: Normal range of motion.  Neurological: She is alert and oriented to person, place, and time.  Skin: Skin is warm and dry.  Psychiatric: She has a normal mood and affect. Her behavior is normal.    ED Course  Procedures (including critical care time)  DIAGNOSTIC STUDIES: Oxygen Saturation is 98% on room air, normal by my interpretation.    COORDINATION OF CARE: 5:30 PM-Discussed treatment plan which includes pain medication and CT-scan with pt at bedside and pt agreed to plan.  Labs Reviewed  URINALYSIS, ROUTINE W REFLEX MICROSCOPIC - Abnormal; Notable for the following:    Ketones, ur 15 (*)    All other components within  normal limits  CBC WITH DIFFERENTIAL - Abnormal; Notable for the following:    Neutrophils Relative % 86 (*)    Neutro Abs 9.0 (*)    Lymphocytes Relative 6 (*)    All other components within normal limits  COMPREHENSIVE METABOLIC PANEL - Abnormal; Notable for the following:    Glucose, Bld 107 (*)    GFR calc non Af Amer 71 (*)    GFR calc Af Amer 83 (*)    All other components within normal limits  PREGNANCY, URINE   Ct Abdomen Pelvis W Contrast  03/27/2013   *RADIOLOGY REPORT*  Clinical Data: Abdominal pain.  History of breast cancer.  CT ABDOMEN AND PELVIS WITH CONTRAST  Technique:  Multidetector CT imaging of the abdomen and pelvis was performed following the standard protocol during bolus administration of intravenous contrast.  Contrast: 50mL OMNIPAQUE IOHEXOL 300 MG/ML  SOLN, OMNIPAQUE IOHEXOL 300 MG/ML  SOLN  Comparison: None.  Findings: The lung bases are clear.  There is no pleural or pericardial effusion.  Postoperative change left breast is incidentally noted.  The gallbladder, spleen, adrenal glands, pancreas and kidneys appear normal.  Small hepatic cyst in the right lobe is identified on image 27.  The appendix has a straight configuration with enhancement of its wall and some periappendiceal inflammatory change.  A tiny appendicolith is seen at the appendiceal orifice into the cecum. Note is made that the patient's cecum is abnormally positioned in the right upper quadrant and the appendix is at the level of the mid ascending colon extending lateral to the colon.  No fluid collection or evidence of perforation is identified.  The stomach and small and large bowel appear normal.  No focal bony abnormality is identified. Mesh from prior radio there is noted.  IMPRESSION:   The study is positive for acute appendicitis without abscess or perforation. Right upper quadrant position of the cecum with associated atypical position of the appendix again noted.   Original Report  Authenticated By: Holley Dexter, M.D.    No diagnosis found.   MDM   Patient with onset of right lower quadrant abdominal pain that started today and gradually became worse. Now she states it's slightly better than it was but denies any associated symptoms. Patient is point tender in the right lower quadrant along an abdominal scar from a breast reconstruction after mastectomy. No palpable ventral hernias however concern for possible incarcerated hernia unable to be palpated versus appendicitis. Lower suspicion for kidney stone as patient has no blood in her urine and symptoms are not classic for kidney stone. Also lower suspicion for ovarian pathology is patient's pain is higher in the abdomen. CBC, CMP are within normal limits. UA and UPT are negative. CT with IV contrast pending for further evaluation.  7:52 PM CT with findings consistent with appendicitis. Will discuss with Gen. surgery for admission and treatment.  I personally performed the services described in this documentation, which was scribed in my presence.  The recorded information has been reviewed and considered.    Virginia Sprout, MD 03/27/13 [id number]

## 2013-03-27 NOTE — Op Note (Signed)
[name] [id number]   PRE-OPERATIVE DIAGNOSIS:  acute appendicitis  POST-OPERATIVE DIAGNOSIS:  acute appendicitis  PROCEDURE:  Procedure(s): APPENDECTOMY LAPAROSCOPIC  SURGEON:  Surgeon(s): Romie Levee, MD  ASSISTANT: none   ANESTHESIA:   local and general  EBL:   30ml  Delay start of Pharmacological VTE agent (>24hrs) due to surgical blood loss or risk of bleeding:  no  DRAINS: none   SPECIMEN:  Source of Specimen:  appendix  DISPOSITION OF SPECIMEN:  PATHOLOGY  COUNTS:  YES  PLAN OF CARE: Admit for overnight observation  PATIENT DISPOSITION:  PACU - hemodynamically stable.   INDICATIONS: Patient with concerning symptoms & work up suspicious for appendicitis.  Surgery was recommended:  The anatomy & physiology of the digestive tract was discussed.  The pathophysiology of appendicitis was discussed.  Natural history risks without surgery was discussed.   I feel the risks of no intervention will lead to serious problems that outweigh the operative risks; therefore, I recommended diagnostic laparoscopy with removal of appendix to remove the pathology.  Laparoscopic & open techniques were discussed.   I noted a good likelihood this will help address the problem.    Risks such as bleeding, infection, abscess, leak, reoperation, possible ostomy, hernia, heart attack, death, and other risks were discussed.  Goals of post-operative recovery were discussed as well.  We will work to minimize complications.  Questions were answered.  The patient expresses understanding & wishes to proceed with surgery.  OR FINDINGS: acute inflammation of peritoneal cavity and appendix  DESCRIPTION:   The patient was identified & brought into the operating room. The patient was positioned supine with left arm tucked. SCDs were active during the entire case. The patient underwent general anesthesia without any difficulty.  A foley catheter was inserted under sterile conditions. The abdomen  was prepped and draped in a sterile fashion. A Surgical Timeout confirmed our plan.   I made a transverse incision through the superior umbilical fold.  I made a nick in the infraumbilical fascia and confirmed peritoneal entry.  I checked for any intra-abdominal adhesions.  I placed a stay suture and then the [facility] port.  We induced carbon dioxide insufflation.  Camera inspection revealed no injury.  I placed additional ports under direct laparoscopic visualization.  I mobilized the terminal ileum to proximal ascending colon in a lateral to medial fashion.  I took care to avoid injuring any retroperitoneal structures.   I freed the appendix off its attachments to the ascending colon and cecal mesentery.  I elevated the appendix. I skeletonized & ligated the mesoappendix using a white load laparoscopic stapler (45mm). I was able to free off the base of the appendix which was still viable.  I stapled the appendix off the cecum using a laparoscopic stapler.  The stapler fired but only cut about half way.  I introduced a new blue load stapler and completed the transection.  I took a healthy cuff viable cecum.  I placed the appendix inside an EndoCatch bag and removed out the [place] port.  I did copious irrigation. Hemostasis was good in the mesoappendix, colon mesentery, and retroperitoneum. Staple line was intact on the cecum with no bleeding. I washed out the pelvis, retrohepatic space and right paracolic gutter. I washed out the left side as well.  Hemostasis is good. There was no perforation or injury.  Because the area cleaned up well after irrigation, I did not place a drain.  I aspirated the carbon dioxide. I removed the ports.  I closed the umbilical fascia site using a 0 Vicryl stitch. I closed skin using 4-0 vicryl stitch.  Sterile dressings were applied.  Patient was extubated and sent to the recovery room.  I discussed the operative findings with the patient's family. I suspect the patient is going  used in the hospital at least overnight and will need antibiotics for 0 days. Questions answered. They expressed understanding and appreciation.

## 2013-03-27 NOTE — ED Notes (Signed)
Surgeon Dr. Maisie Fus at bedside

## 2013-03-27 NOTE — ED Notes (Signed)
OR called and informed that they are ready for the patient. Surgeon says that antibiotic will be taken care of in the OR.

## 2013-03-27 NOTE — ED Notes (Signed)
[id number]<AV> Expected date:<BR> Expected time:<BR> Means of arrival:<BR> Comments:<BR> [facility] Acute Apendectomy

## 2013-03-27 NOTE — H&P (Signed)
[name] is an 52 y.o. female.   Chief Complaint: rlq abd pain HPI: pt presents with abd pain since 11am.   She has had no other associated symptoms. She presented to [facility] and a ct has shown concern for appendicitis.  She was transferred to [facility] for definitive care.  Past Medical History  Diagnosis Date  . Unspecified asthma(493.90)   . Allergic rhinitis, cause unspecified   . Breast cancer     chemo; left    Past Surgical History  Procedure Laterality Date  . Mastectomy      left; chemo  . Sinus surgery with instatrak      Family History  Problem Relation Age of Onset  . Other Father     Lambert-Eaton neuromuscular degenerative disease   Social History:  reports that she has never smoked. She does not have any smokeless tobacco history on file. She reports that she does not drink alcohol. Her drug history is not on file.  Allergies: No Known Allergies   (Not in a hospital admission)  Results for orders placed during the hospital encounter of 03/27/13 (from the past 48 hour(s))  URINALYSIS, ROUTINE W REFLEX MICROSCOPIC     Status: Abnormal   Collection Time    03/27/13  4:40 PM      Result Value Range   Color, Urine YELLOW  YELLOW   APPearance CLEAR  CLEAR   Specific Gravity, Urine 1.016  1.005 - 1.030   pH 6.0  5.0 - 8.0   Glucose, UA NEGATIVE  NEGATIVE mg/dL   Hgb urine dipstick NEGATIVE  NEGATIVE   Bilirubin Urine NEGATIVE  NEGATIVE   Ketones, ur 15 (*) NEGATIVE mg/dL   Protein, ur NEGATIVE  NEGATIVE mg/dL   Urobilinogen, UA 0.2  0.0 - 1.0 mg/dL   Nitrite NEGATIVE  NEGATIVE   Leukocytes, UA NEGATIVE  NEGATIVE   Comment: MICROSCOPIC NOT DONE ON URINES WITH NEGATIVE PROTEIN, BLOOD, LEUKOCYTES, NITRITE, OR GLUCOSE <1000 mg/dL.  PREGNANCY, URINE     Status: None   Collection Time    03/27/13  4:40 PM      Result Value Range   Preg Test, Ur NEGATIVE  NEGATIVE  CBC WITH DIFFERENTIAL     Status: Abnormal   Collection Time    03/27/13  5:55 PM      Result  Value Range   WBC 10.5  4.0 - 10.5 K/uL   RBC 4.07  3.87 - 5.11 MIL/uL   Hemoglobin 13.2  12.0 - 15.0 g/dL   HCT 16.1  09.6 - 04.5 %   MCV 95.6  78.0 - 100.0 fL   MCH 32.4  26.0 - 34.0 pg   MCHC 33.9  30.0 - 36.0 g/dL   RDW 40.9  81.1 - 91.4 %   Platelets 178  150 - 400 K/uL   Neutrophils Relative % 86 (*) 43 - 77 %   Neutro Abs 9.0 (*) 1.7 - 7.7 K/uL   Lymphocytes Relative 6 (*) 12 - 46 %   Lymphs Abs 0.7  0.7 - 4.0 K/uL   Monocytes Relative 8  3 - 12 %   Monocytes Absolute 0.8  0.1 - 1.0 K/uL   Eosinophils Relative 1  0 - 5 %   Eosinophils Absolute 0.1  0.0 - 0.7 K/uL   Basophils Relative 0  0 - 1 %   Basophils Absolute 0.0  0.0 - 0.1 K/uL  COMPREHENSIVE METABOLIC PANEL     Status: Abnormal  Collection Time    03/27/13  5:55 PM      Result Value Range   Sodium 138  135 - 145 mEq/L   Potassium 3.7  3.5 - 5.1 mEq/L   Chloride 100  96 - 112 mEq/L   CO2 28  19 - 32 mEq/L   Glucose, Bld 107 (*) 70 - 99 mg/dL   BUN 12  6 - 23 mg/dL   Creatinine, Ser 1.61  0.50 - 1.10 mg/dL   Calcium 9.9  8.4 - 09.6 mg/dL   Total Protein 6.9  6.0 - 8.3 g/dL   Albumin 4.0  3.5 - 5.2 g/dL   AST 18  0 - 37 U/L   ALT 18  0 - 35 U/L   Alkaline Phosphatase 87  39 - 117 U/L   Total Bilirubin 0.4  0.3 - 1.2 mg/dL   GFR calc non Af Amer 71 (*) >90 mL/min   GFR calc Af Amer 83 (*) >90 mL/min   Comment:            The eGFR has been calculated     using the CKD EPI equation.     This calculation has not been     validated in all clinical     situations.     eGFR's persistently     <90 mL/min signify     possible Chronic Kidney Disease.   Ct Abdomen Pelvis W Contrast  03/27/2013   *RADIOLOGY REPORT*  Clinical Data: Abdominal pain.  History of breast cancer.  CT ABDOMEN AND PELVIS WITH CONTRAST  Technique:  Multidetector CT imaging of the abdomen and pelvis was performed following the standard protocol during bolus administration of intravenous contrast.  Contrast: 50mL OMNIPAQUE IOHEXOL 300 MG/ML   SOLN, OMNIPAQUE IOHEXOL 300 MG/ML  SOLN  Comparison: None.  Findings: The lung bases are clear.  There is no pleural or pericardial effusion.  Postoperative change left breast is incidentally noted.  The gallbladder, spleen, adrenal glands, pancreas and kidneys appear normal.  Small hepatic cyst in the right lobe is identified on image 27.  The appendix has a straight configuration with enhancement of its wall and some periappendiceal inflammatory change.  A tiny appendicolith is seen at the appendiceal orifice into the cecum. Note is made that the patient's cecum is abnormally positioned in the right upper quadrant and the appendix is at the level of the mid ascending colon extending lateral to the colon.  No fluid collection or evidence of perforation is identified.  The stomach and small and large bowel appear normal.  No focal bony abnormality is identified. Mesh from prior radio there is noted.  IMPRESSION:   The study is positive for acute appendicitis without abscess or perforation. Right upper quadrant position of the cecum with associated atypical position of the appendix again noted.   Original Report Authenticated By: Holley Dexter, M.D.    Review of Systems  Constitutional: Negative for fever and chills.  Respiratory: Negative for cough and shortness of breath.   Cardiovascular: Negative for chest pain.  Gastrointestinal: Positive for abdominal pain. Negative for nausea, vomiting, diarrhea and constipation.  Genitourinary: Negative for dysuria, urgency and frequency.  Skin: Negative for rash.    Blood pressure 116/75, pulse 100, temperature 98.5 F (36.9 C), temperature source Oral, resp. rate 16, height 5\' 4"  (1.626 m), weight 135 lb (61.236 kg), SpO2 98.00%. Physical Exam  Constitutional: She is oriented to person, place, and time. She appears well-developed and well-nourished.  HENT:  Head: Normocephalic and atraumatic.  Eyes: Conjunctivae are normal. Pupils are equal,  round, and reactive to light.  Neck: Normal range of motion.  Cardiovascular: Normal rate and regular rhythm.   Respiratory: Effort normal and breath sounds normal. No respiratory distress.  GI: Soft. Bowel sounds are normal. She exhibits no distension. There is tenderness.  RLQ  Musculoskeletal: Normal range of motion.  Neurological: She is alert and oriented to person, place, and time.  Skin: Skin is warm and dry.     Assessment/Plan Katelyne L Nghiem is 52 y.o. F with RLQ pain for ~12 h. She underwent a ct that shows a dilated appendix with stranding.  Her history and physical are consistent with appendicitis.  The risk and benefits of surgical resection were explained to the patient.  These include bleeding, infection, damage to adjacent structures, abscess, leak and complications associated with anesthesia.  I believe she understands these risk and has agreed to proceed.    Felicha Frayne C. 03/27/2013, 8:26 PM

## 2013-03-27 NOTE — ED Notes (Signed)
Patient with acute onset of RLQ pain that started around 11am this morning.  Patient denies any nausea or vomiting.  Patient took 2 bowel stimulant which usually work for her but without relief.  Pain is stationary on that RLQ

## 2013-03-27 NOTE — Transfer of Care (Signed)
Immediate Anesthesia Transfer of Care Note  Patient: [name]  Procedure(s) Performed: Procedure(s) (LRB): APPENDECTOMY LAPAROSCOPIC (N/A)  Patient Location: PACU  Anesthesia Type: General  Level of Consciousness: sedated, patient cooperative and responds to stimulaton  Airway & Oxygen Therapy: Patient Spontanous Breathing and Patient connected to face mask oxgen  Post-op Assessment: Report given to PACU RN and Post -op Vital signs reviewed and stable  Post vital signs: Reviewed and stable  Complications: No apparent anesthesia complications

## 2013-03-27 NOTE — ED Notes (Signed)
CT notified that pt has completed oral contrast. 

## 2013-03-27 NOTE — ED Notes (Signed)
Transferred by POV to [facility] ED- PIV intact per [facility] from [facility]. Secured with kling for transport. - Confirmed transfer with Elmarie Shiley, RN charge nurse

## 2013-03-28 ENCOUNTER — Encounter (HOSPITAL_COMMUNITY): Payer: Self-pay | Admitting: *Deleted

## 2013-03-28 MED ORDER — OXYCODONE-ACETAMINOPHEN 5-325 MG PO TABS: 1.0000 | ORAL_TABLET | ORAL | Status: DC | PRN

## 2013-03-28 NOTE — Discharge Summary (Signed)
Physician Discharge Summary  Patient ID: [name] MRN: [id number] DOB/AGE: Oct 31, 1960 52 y.o.  Admit date: 03/27/2013 Discharge date: 03/28/2013  Admission Diagnoses: acute appendicitis  Discharge Diagnoses: acute appendicitis Principal Problem:   Acute appendicitis   Discharged Condition: good  Hospital Course: The patient was admitted after lap appendectomy for appendicitis.  She did well overnight.  She was discharged once able to tolerate a regular diet and PO narcotics.  Consults: None  Significant Diagnostic Studies: labs: CBC, Chemistry  Treatments: IV hydration, antibiotics: invanz, analgesia: acetaminophen w/ codeine and Morphine and surgery: lap appy  Discharge Exam: Blood pressure 93/54, pulse 85, temperature 98 F (36.7 C), temperature source Oral, resp. rate 14, height 5\' 4"  (1.626 m), weight 135 lb (61.236 kg), SpO2 96.00%. General appearance: alert and cooperative GI: normal findings: soft, non-tender Incision/Wound: clean, dry, intact  Disposition: Home   Future Appointments Provider Department Dept Phone   04/29/2013 9:00 AM [facility] Dx Dexa 1 [facility] - [phone] [place] [phone]   04/29/2013 9:30 AM [facility] Tomo[facility] [place]  IMAGING [phone]   Patient should wear two piece clothing and wear no powder or deodorant. Patient should arrive 15 minutes early.   05/23/2013 1:30 PM Waymon Budge, MD [facility] [phone]   07/06/2013 9:30 AM Krista Blue [facility] MEDICAL ONCOLOGY [phone]   07/06/2013 10:00 AM [facility] Covering Provider 1 [facility] MEDICAL ONCOLOGY [phone]       Medication List         albuterol 108 (90 BASE) MCG/ACT inhaler  Commonly known as:  PROAIR HFA  Inhale 2 puffs into the lungs 4 (four) times daily as needed for wheezing or shortness of breath.     ALPRAZolam 0.5 MG tablet  Commonly known as:  XANAX  Take 0.5 mg by mouth at bedtime  as needed.     azelastine 137 MCG/SPRAY nasal spray  Commonly known as:  ASTELIN  1-2 sprays each nostril at bedtime     Azelastine-Fluticasone 137-50 MCG/ACT Susp  Commonly known as:  DYMISTA  Place 2 sprays into both nostrils at bedtime.     buPROPion 300 MG 24 hr tablet  Commonly known as:  WELLBUTRIN XL  Take 300 mg by mouth daily.     DULoxetine 30 MG capsule  Commonly known as:  CYMBALTA  Take 30 mg by mouth daily.     EPINEPHrine 0.3 mg/0.3 mL Devi  Commonly known as:  EPIPEN  Inject 0.3 mLs (0.3 mg total) into the muscle once. As needed for severe reaction     fluticasone 50 MCG/ACT nasal spray  Commonly known as:  FLONASE  1-2 sprays each nostril at bedtime     magnesium oxide 400 MG tablet  Commonly known as:  MAG-OX  Take 400 mg by mouth 2 (two) times daily.     methocarbamol 500 MG tablet  Commonly known as:  ROBAXIN  Take 500 mg by mouth 1 day or 1 dose.     multivitamin tablet  Take 1 tablet by mouth daily.     oxyCODONE-acetaminophen 5-325 MG per tablet  Commonly known as:  PERCOCET/ROXICET  Take 1-2 tablets by mouth every 4 (four) hours as needed.     SUMAtriptan 50 MG tablet  Commonly known as:  IMITREX  Take 50 mg by mouth every 2 (two) hours as needed for migraine.     Vitamin D-3 5000 UNITS Tabs  Take 1 tablet three times a week  Follow-up Information   Follow up with Vanita Panda., MD. Schedule an appointment as soon as possible for a visit in 2 weeks.   Contact information:   [address] [place] [address] [phone]       Signed: Vanita Panda, MD  Colorectal and General Surgery [facility]

## 2013-03-28 NOTE — Care Management Note (Signed)
    Page 1 of 1   03/28/2013     11:02:09 AM   CARE MANAGEMENT NOTE 03/28/2013  Patient:  [name], [name]   Account Number:  [id number]  Date Initiated:  03/28/2013  Documentation initiated by:  Lorenda Ishihara  Subjective/Objective Assessment:   52 yo female admitted s/p lap appy     Action/Plan:   Home when stable   Anticipated DC Date:  03/28/2013   Anticipated DC Plan:  HOME/SELF CARE      DC Planning Services  CM consult      Choice offered to / List presented to:             Status of service:  Completed, signed off Medicare Important Message given?   (If response is "NO", the following Medicare IM given date fields will be blank) Date Medicare IM given:   Date Additional Medicare IM given:    Discharge Disposition:  HOME/SELF CARE  Per UR Regulation:  Reviewed for med. necessity/level of care/duration of stay  If discussed at Long Length of Stay Meetings, dates discussed:    Comments:

## 2013-03-31 ENCOUNTER — Encounter (INDEPENDENT_AMBULATORY_CARE_PROVIDER_SITE_OTHER): Payer: Self-pay

## 2013-04-11 ENCOUNTER — Ambulatory Visit (INDEPENDENT_AMBULATORY_CARE_PROVIDER_SITE_OTHER): Payer: BC Managed Care – PPO | Admitting: General Surgery

## 2013-04-11 ENCOUNTER — Encounter (INDEPENDENT_AMBULATORY_CARE_PROVIDER_SITE_OTHER): Payer: Self-pay | Admitting: General Surgery

## 2013-04-11 VITALS — BP 100/60 | HR 72 | Resp 14 | Ht 64.0 in | Wt 138.8 lb

## 2013-04-11 DIAGNOSIS — Z9889 Other specified postprocedural states: Secondary | ICD-10-CM

## 2013-04-11 NOTE — Progress Notes (Signed)
[name] is a 52 y.o. female who is status post an appendectomy.  She is doing well.  She has no pain.  She is eating well and having regular BM's.  Objective: Filed Vitals:   04/11/13 1714  BP: 100/60  Pulse: 72  Resp: 14    General appearance: alert and cooperative GI: soft, non-tender; bowel sounds normal; no masses,  no organomegaly  Incision: healing well   Assessment: s/p  Patient Active Problem List   Diagnosis Date Noted  . Acute maxillary sinusitis 09/20/2011  . Seasonal and perennial allergic rhinitis 10/25/2007  . ASTHMA 10/25/2007  . MASTECTOMY, LEFT, HX OF 10/25/2007    Plan: RTO PRN.  Doing well    .Vanita Panda, MD [facility], [place] [phone]   04/11/2013 5:27 PM

## 2013-04-11 NOTE — Patient Instructions (Signed)
Return to office if needed.  No heavy lifting for 8 weeks after surgery

## 2013-04-18 NOTE — Anesthesia Postprocedure Evaluation (Signed)
  Anesthesia Post-op Note  Patient: [name]  Procedure(s) Performed: Procedure(s) (LRB): APPENDECTOMY LAPAROSCOPIC (N/A)  Patient Location: PACU  Anesthesia Type: General  Level of Consciousness: awake and alert   Airway and Oxygen Therapy: Patient Spontanous Breathing  Post-op Pain: mild  Post-op Assessment: Post-op Vital signs reviewed, Patient's Cardiovascular Status Stable, Respiratory Function Stable, Patent Airway and No signs of Nausea or vomiting  Last Vitals:  Filed Vitals:   03/28/13 0529  BP: 93/54  Pulse: 85  Temp: 36.7 C  Resp: 14    Post-op Vital Signs: stable   Complications: No apparent anesthesia complications

## 2013-04-29 ENCOUNTER — Other Ambulatory Visit: Payer: 59

## 2013-04-29 ENCOUNTER — Other Ambulatory Visit: Payer: Self-pay | Admitting: Physician Assistant

## 2013-04-29 ENCOUNTER — Ambulatory Visit
Admission: RE | Admit: 2013-04-29 | Discharge: 2013-04-29 | Disposition: A | Discharge status: Home / Self Care | Payer: BC Managed Care – PPO | Source: Ambulatory Visit | Attending: Physician Assistant | Admitting: Physician Assistant

## 2013-04-29 DIAGNOSIS — Z9010 Acquired absence of unspecified breast and nipple: Secondary | ICD-10-CM

## 2013-05-23 ENCOUNTER — Ambulatory Visit (INDEPENDENT_AMBULATORY_CARE_PROVIDER_SITE_OTHER): Payer: BC Managed Care – PPO | Admitting: Internal Medicine

## 2013-05-23 ENCOUNTER — Encounter: Payer: Self-pay | Admitting: Internal Medicine

## 2013-05-23 VITALS — BP 112/72 | HR 80 | Ht 63.5 in | Wt 141.4 lb

## 2013-05-23 DIAGNOSIS — J45998 Other asthma: Secondary | ICD-10-CM

## 2013-05-23 DIAGNOSIS — J309 Allergic rhinitis, unspecified: Secondary | ICD-10-CM

## 2013-05-23 DIAGNOSIS — J302 Other seasonal allergic rhinitis: Secondary | ICD-10-CM

## 2013-05-23 DIAGNOSIS — J45909 Unspecified asthma, uncomplicated: Secondary | ICD-10-CM

## 2013-05-23 NOTE — Assessment & Plan Note (Signed)
She is satisfied with current management and will continue allergy vaccine

## 2013-05-23 NOTE — Assessment & Plan Note (Signed)
Good control. Viral infections are key.

## 2013-05-23 NOTE — Progress Notes (Signed)
Patient ID: [name], female   DOB: [birth year], 52 y.o.   MRN: [id number]  Asthma Her past medical history is significant for asthma.   01/21/11- 50 yoF never smoker followed for allergic rhinitis, asthma, complicated by hx breast cancer/ mastectomy.  Last here Jun 27, 2010 Had done well through winter and spring till she noted onset of nasal drainage and some chest tightness while out shopping. No fever, sore throat or discolored sputum. Went to UC 4 days ago. Told to take allegra. Now chest feels well. Head still draining. A little cough, no wheeze. Light headed. Onset with some headache, not now. Has not had problem with her allergy shots still at 1:10.   05/16/11- 50 yoF never smoker followed for allergic rhinitis, asthma, complicated by hx breast cancer/ mastectomy.  Acute visit-"major attack" second this year- nasal congestion, clear mucus. Cough at night. Denies fever, sore throat. Using Allegra-D . Does do saline nasal rinse.   09/17/11- 50 yoF never smoker followed for allergic rhinitis, asthma, complicated by hx breast cancer/ mastectomy.  Acute visit; sinus headache and excessive drainage x 2 months-not seeing any color to the drainage now Frontal headache and maxillary sinus pressure since Thanksgiving. Took amoxicillin then with temporary help. No discharge now. Taking Mucinex D alternating with Allegra-D. Denies any discomfort in her ears or chest.  05/21/12- 51 yoF never smoker followed for allergic rhinitis, asthma, complicated by hx breast cancer/ mastectomy Doing well on vaccine 1:10 GO and doing well with it; slight flare up with allergies few weeks ago but back to normal now. Has not needed inhaler in 2 years. No recent sinus infection. Uses Allegra-D.  05/23/13- 52 yoF never smoker followed for allergic rhinitis, asthma, complicated by hx breast cancer/ mastectomy FOLLOWS FOR: Allergies improved since last OV. No complaints or concern today Allergy vaccine 1:10 GO Better  year with les need for benadryl. Likes Astelin nasal spray.  No need for rescue inhaler in 2 years- no major respiratory infections.  Review of Systems- see HPI Constitutional:   No weight loss, night sweats,  , chills, fatigue, lassitude. HEENT:   + headaches,  No- Difficulty swallowing,  Tooth/dental problems,  Sore throat,  CV:  No chest pain,  Orthopnea, PND, swelling in lower extremities, anasarca, dizziness, palpitations GI  No heartburn, indigestion, abdominal pain, nausea, vomiting,  Resp: No shortness of breath with exertion or at rest.  No excess mucus, no productive cough,            no- non-productive cough,  No coughing up of blood.  No change in color of mucus.  Skin: no rash or lesions. GU: . MS:  No joint pain or swelling.   Psych:  No change in mood or affect. No depression or anxiety.  No memory loss.   Objective:   Physical Exam General- Alert, Oriented, Affect-appropriate, Distress- none acute Skin- rash-none, lesions- none, excoriation- none Lymphadenopathy- none Head- atraumatic            Eyes- Gross vision intact, PERRLA, conjunctivae clear secretions            Ears- Hearing, canals-normal            Nose- thick white mucus,  no-Septal dev, mucus, polyps, erosion, perforation             Throat- Mallampati II , mucosa clear , drainage- none, tonsils- atrophic Neck- flexible , trachea midline, no stridor , thyroid nl, carotid no bruit Chest - symmetrical excursion ,  unlabored           Heart/CV- RRR , no murmur , no gallop  , no rub, nl s1 s2                           - JVD- none , edema- none, stasis changes- none, varices- none           Lung- clear to P&A, wheeze- none, cough- none , dullness-none, rub- none           Chest wall-  L mastectomy Abd- Br/ Gen/ Rectal- Not done, not indicated Extrem- cyanosis- none, clubbing, none, atrophy- none, strength- nl Neuro- grossly intact to observation

## 2013-05-23 NOTE — Patient Instructions (Signed)
We can continue allergy vaccine 1:10 GO, and current meds  Flu vax  Please call as needed

## 2013-07-05 ENCOUNTER — Telehealth: Payer: Self-pay | Admitting: Internal Medicine

## 2013-07-06 ENCOUNTER — Ambulatory Visit: Payer: 59

## 2013-07-06 ENCOUNTER — Other Ambulatory Visit: Payer: Self-pay | Admitting: Lab

## 2013-07-08 ENCOUNTER — Ambulatory Visit: Payer: 59 | Admitting: Oncology

## 2013-07-14 ENCOUNTER — Other Ambulatory Visit: Payer: Self-pay

## 2013-08-23 ENCOUNTER — Ambulatory Visit (INDEPENDENT_AMBULATORY_CARE_PROVIDER_SITE_OTHER): Payer: BC Managed Care – PPO

## 2013-08-23 ENCOUNTER — Telehealth: Payer: Self-pay | Admitting: Internal Medicine

## 2013-08-23 DIAGNOSIS — J309 Allergic rhinitis, unspecified: Secondary | ICD-10-CM

## 2013-08-23 MED ORDER — ALBUTEROL SULFATE HFA 108 (90 BASE) MCG/ACT IN AERS: 2.0000 | INHALATION_SPRAY | Freq: Four times a day (QID) | RESPIRATORY_TRACT | Status: DC | PRN

## 2013-08-23 MED ORDER — DOXYCYCLINE HYCLATE 100 MG PO TABS: ORAL_TABLET | ORAL | Status: DC

## 2013-08-23 NOTE — Telephone Encounter (Signed)
Spoke to pt. Reports a dry cough and SOB. Denies fever/chills, chest tightness. Thinks she might be getting bronchitis. Would like something called in.  Pt did request for ProAir to be refilled, this has already been sent.  No Known Allergies  Current Outpatient Prescriptions on File Prior to Visit  Medication Sig Dispense Refill  . ALPRAZolam (XANAX) 0.5 MG tablet Take 0.5 mg by mouth at bedtime as needed.        Marland Kitchen azelastine (ASTELIN) 137 MCG/SPRAY nasal spray 1-2 sprays each nostril at bedtime  30 mL  prn  . buPROPion (WELLBUTRIN XL) 300 MG 24 hr tablet Take 300 mg by mouth daily.        . Cholecalciferol (VITAMIN D-3) 5000 UNITS TABS Take 1 tablet three times a week      . DULoxetine (CYMBALTA) 30 MG capsule Take 30 mg by mouth daily.        . fluticasone (FLONASE) 50 MCG/ACT nasal spray 1-2 sprays each nostril at bedtime  16 g  prn  . magnesium oxide (MAG-OX) 400 MG tablet Take 400 mg by mouth 2 (two) times daily.      . methocarbamol (ROBAXIN) 500 MG tablet Take 750 mg by mouth 1 day or 1 dose.       . SUMAtriptan (IMITREX) 50 MG tablet Take 50 mg by mouth every 2 (two) hours as needed for migraine.       No current facility-administered medications on file prior to visit.    CY - please advise.  Thanks.

## 2013-08-23 NOTE — Telephone Encounter (Signed)
Doxycycline 100mg , # 8  2 today then one daily    Ok to try mucinex DM

## 2013-08-23 NOTE — Telephone Encounter (Signed)
Spoke with the pt and notified of recs per CDY  She verbalized understanding  Rx was sent to pharm  

## 2013-12-21 ENCOUNTER — Other Ambulatory Visit: Payer: Self-pay | Admitting: Obstetrics and Gynecology

## 2013-12-21 ENCOUNTER — Other Ambulatory Visit (HOSPITAL_COMMUNITY)
Admission: RE | Admit: 2013-12-21 | Discharge: 2013-12-21 | Disposition: A | Discharge status: Home / Self Care | Payer: BC Managed Care – PPO | Source: Ambulatory Visit | Attending: Obstetrics and Gynecology | Admitting: Obstetrics and Gynecology

## 2013-12-21 DIAGNOSIS — Z01419 Encounter for gynecological examination (general) (routine) without abnormal findings: Secondary | ICD-10-CM | POA: Insufficient documentation

## 2014-03-28 ENCOUNTER — Other Ambulatory Visit: Payer: Self-pay

## 2014-03-28 ENCOUNTER — Telehealth: Payer: Self-pay | Admitting: *Deleted

## 2014-03-28 DIAGNOSIS — Z9010 Acquired absence of unspecified breast and nipple: Secondary | ICD-10-CM

## 2014-03-28 DIAGNOSIS — Z9012 Acquired absence of left breast and nipple: Secondary | ICD-10-CM

## 2014-03-28 DIAGNOSIS — Z1231 Encounter for screening mammogram for malignant neoplasm of breast: Secondary | ICD-10-CM

## 2014-03-28 NOTE — Telephone Encounter (Signed)
Bone density scan received from [facility]. Placed on Dr. Mariana Kaufman desk for review

## 2014-03-28 NOTE — Telephone Encounter (Signed)
Patient called asking about yearly appointment. Let patient know Dr. Marko Plume is back and can see her. POF placed for appointment. Let patient know that someone from [facility] will call her with scheduled appointment and if she does not hear anything for 3 weeks to call back.

## 2014-03-29 ENCOUNTER — Telehealth: Payer: Self-pay | Admitting: Hematology and Oncology

## 2014-03-29 NOTE — Telephone Encounter (Signed)
Lvm advising of appt 05/24/14. Mailed appt calendar.

## 2014-04-05 ENCOUNTER — Ambulatory Visit (INDEPENDENT_AMBULATORY_CARE_PROVIDER_SITE_OTHER): Payer: BC Managed Care – PPO

## 2014-04-05 DIAGNOSIS — J309 Allergic rhinitis, unspecified: Secondary | ICD-10-CM

## 2014-05-08 ENCOUNTER — Ambulatory Visit
Admission: RE | Admit: 2014-05-08 | Discharge: 2014-05-08 | Disposition: A | Discharge status: Home / Self Care | Payer: BC Managed Care – PPO | Source: Ambulatory Visit

## 2014-05-08 DIAGNOSIS — Z1231 Encounter for screening mammogram for malignant neoplasm of breast: Secondary | ICD-10-CM

## 2014-05-08 DIAGNOSIS — Z9012 Acquired absence of left breast and nipple: Secondary | ICD-10-CM

## 2014-05-21 ENCOUNTER — Other Ambulatory Visit: Payer: Self-pay | Admitting: Oncology

## 2014-05-23 ENCOUNTER — Encounter: Payer: Self-pay | Admitting: Internal Medicine

## 2014-05-23 ENCOUNTER — Ambulatory Visit: Payer: BC Managed Care – PPO | Admitting: Internal Medicine

## 2014-05-23 VITALS — BP 124/78 | HR 75 | Ht 64.0 in | Wt 147.0 lb

## 2014-05-23 DIAGNOSIS — Z23 Encounter for immunization: Secondary | ICD-10-CM

## 2014-05-23 NOTE — Progress Notes (Signed)
Patient ID: [name], female   DOB: 1961/05/08, 53 y.o.   MRN: [id number]  Asthma Her past medical history is significant for asthma.   01/21/11- 53 yoF never smoker followed for allergic rhinitis, asthma, complicated by hx breast cancer/ mastectomy.  Last here Jun 27, 2010 Had done well through winter and spring till she noted onset of nasal drainage and some chest tightness while out shopping. No fever, sore throat or discolored sputum. Went to UC 4 days ago. Told to take allegra. Now chest feels well. Head still draining. A little cough, no wheeze. Light headed. Onset with some headache, not now. Has not had problem with her allergy shots still at 1:10.   05/16/11- 53 yoF never smoker followed for allergic rhinitis, asthma, complicated by hx breast cancer/ mastectomy.  Acute visit-"major attack" second this year- nasal congestion, clear mucus. Cough at night. Denies fever, sore throat. Using Allegra-D . Does do saline nasal rinse.   09/17/11- 53 yoF never smoker followed for allergic rhinitis, asthma, complicated by hx breast cancer/ mastectomy.  Acute visit; sinus headache and excessive drainage x 2 months-not seeing any color to the drainage now Frontal headache and maxillary sinus pressure since Thanksgiving. Took amoxicillin then with temporary help. No discharge now. Taking Mucinex D alternating with Allegra-D. Denies any discomfort in her ears or chest.  05/21/12- 53 yoF never smoker followed for allergic rhinitis, asthma, complicated by hx breast cancer/ mastectomy Doing well on vaccine 1:10 GO and doing well with it; slight flare up with allergies few weeks ago but back to normal now. Has not needed inhaler in 2 years. No recent sinus infection. Uses Allegra-D.  05/23/13- 53 yoF never smoker followed for allergic rhinitis, asthma, complicated by hx breast cancer/ mastectomy FOLLOWS FOR: Allergies improved since last OV. No complaints or concern today Allergy vaccine 1:10 GO Better  year with les need for benadryl. Likes Astelin nasal spray.  No need for rescue inhaler in 2 years- no major respiratory infections.  05/23/14- 53 yoF never smoker followed for allergic rhinitis, asthma, complicated by hx breast cancer/ mastectomy FOLLOWS FOR: Pt states she had a "bad allergy spell" in last week of August. Pt states she had drainage that progressed into a chest cold, but has now resolved. Pt states taking the flonase and vit C is helping. Pt taking allergy vaccine1:10 GO and she is doing well with it.     Review of Systems- see HPI Constitutional:   No weight loss, night sweats,  , chills, fatigue, lassitude. HEENT:   + headaches,  No- Difficulty swallowing,  Tooth/dental problems,  Sore throat,  CV:  No chest pain,  Orthopnea, PND, swelling in lower extremities, anasarca, dizziness, palpitations GI  No heartburn, indigestion, abdominal pain, nausea, vomiting,  Resp: No shortness of breath with exertion or at rest.  No excess mucus, no productive cough,            no- non-productive cough,  No coughing up of blood.  No change in color of mucus.  Skin: no rash or lesions. GU: . MS:  No joint pain or swelling.   Psych:  No change in mood or affect. No depression or anxiety.  No memory loss.   Objective:   Physical Exam General- Alert, Oriented, Affect-appropriate, Distress- none acute Skin- rash-none, lesions- none, excoriation- none Lymphadenopathy- none Head- atraumatic            Eyes- Gross vision intact, PERRLA, conjunctivae clear secretions  Ears- Hearing, canals-normal            Nose- thick white mucus,  no-Septal dev, mucus, polyps, erosion, perforation             Throat- Mallampati II , mucosa clear , drainage- none, tonsils- atrophic Neck- flexible , trachea midline, no stridor , thyroid nl, carotid no bruit Chest - symmetrical excursion , unlabored           Heart/CV- RRR , no murmur , no gallop  , no rub, nl s1 s2                           - JVD-  none , edema- none, stasis changes- none, varices- none           Lung- clear to P&A, wheeze- none, cough- none , dullness-none, rub- none           Chest wall-  L mastectomy Abd- Br/ Gen/ Rectal- Not done, not indicated Extrem- cyanosis- none, clubbing, none, atrophy- none, strength- nl Neuro- grossly intact to observation

## 2014-05-23 NOTE — Patient Instructions (Signed)
Flu vax  We can continue Allergy vaccine 1:10 GO  Please call as needed

## 2014-05-24 ENCOUNTER — Encounter: Payer: Self-pay | Admitting: Oncology

## 2014-05-24 ENCOUNTER — Telehealth: Payer: Self-pay | Admitting: Oncology

## 2014-05-24 ENCOUNTER — Other Ambulatory Visit (HOSPITAL_BASED_OUTPATIENT_CLINIC_OR_DEPARTMENT_OTHER): Payer: BC Managed Care – PPO

## 2014-05-24 ENCOUNTER — Ambulatory Visit (HOSPITAL_BASED_OUTPATIENT_CLINIC_OR_DEPARTMENT_OTHER): Payer: BC Managed Care – PPO | Admitting: Oncology

## 2014-05-24 VITALS — BP 125/87 | HR 61 | Temp 97.8°F | Resp 18 | Ht 64.0 in | Wt 147.7 lb

## 2014-05-24 DIAGNOSIS — Z853 Personal history of malignant neoplasm of breast: Secondary | ICD-10-CM

## 2014-05-24 DIAGNOSIS — Z1231 Encounter for screening mammogram for malignant neoplasm of breast: Secondary | ICD-10-CM

## 2014-05-24 DIAGNOSIS — C50912 Malignant neoplasm of unspecified site of left female breast: Secondary | ICD-10-CM | POA: Insufficient documentation

## 2014-05-24 DIAGNOSIS — Z9010 Acquired absence of unspecified breast and nipple: Secondary | ICD-10-CM

## 2014-05-24 LAB — CBC WITH DIFFERENTIAL/PLATELET
BASO%: 0.9 % (ref 0.0–2.0)
Basophils Absolute: 0 10*3/uL (ref 0.0–0.1)
EOS ABS: 0.1 10*3/uL (ref 0.0–0.5)
EOS%: 2.9 % (ref 0.0–7.0)
HCT: 39.2 % (ref 34.8–46.6)
HGB: 13 g/dL (ref 11.6–15.9)
LYMPH#: 1.2 10*3/uL (ref 0.9–3.3)
LYMPH%: 34.5 % (ref 14.0–49.7)
MCH: 31.7 pg (ref 25.1–34.0)
MCHC: 33.2 g/dL (ref 31.5–36.0)
MCV: 95.4 fL (ref 79.5–101.0)
MONO#: 0.5 10*3/uL (ref 0.1–0.9)
MONO%: 12.8 % (ref 0.0–14.0)
NEUT%: 48.9 % (ref 38.4–76.8)
NEUTROS ABS: 1.8 10*3/uL (ref 1.5–6.5)
Platelets: 219 10*3/uL (ref 145–400)
RBC: 4.11 10*6/uL (ref 3.70–5.45)
RDW: 12.6 % (ref 11.2–14.5)
WBC: 3.6 10*3/uL — AB (ref 3.9–10.3)

## 2014-05-24 LAB — COMPREHENSIVE METABOLIC PANEL (CC13)
ALBUMIN: 3.9 g/dL (ref 3.5–5.0)
ALT: 15 U/L (ref 0–55)
ANION GAP: 10 meq/L (ref 3–11)
AST: 20 U/L (ref 5–34)
Alkaline Phosphatase: 100 U/L (ref 40–150)
BUN: 12 mg/dL (ref 7.0–26.0)
CALCIUM: 9.6 mg/dL (ref 8.4–10.4)
CHLORIDE: 103 meq/L (ref 98–109)
CO2: 28 meq/L (ref 22–29)
Creatinine: 0.9 mg/dL (ref 0.6–1.1)
Glucose: 92 mg/dl (ref 70–140)
POTASSIUM: 4.1 meq/L (ref 3.5–5.1)
SODIUM: 140 meq/L (ref 136–145)
TOTAL PROTEIN: 7 g/dL (ref 6.4–8.3)
Total Bilirubin: 0.36 mg/dL (ref 0.20–1.20)

## 2014-05-24 NOTE — Telephone Encounter (Signed)
[facility] PT APPT SCHEDULE FOR MAMMO 05/15/15. PT AWARE SCHEDULE FOR LL FOR 60YR NOT AVAILABLE. PT AWARE SHE WILL BE CONTACTED RE 60YR F/U. REMINDER TO SCHEDULING (AM - [facility] - MD)

## 2014-05-24 NOTE — Progress Notes (Signed)
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 She has had mostly yearly follow up with medical oncology due to the previous chemotherapy. She has been on observation since completing 5 years of tamoxifen March 2004. Most recent right mammogram was at [facility] 05-08-14, with heterogeneously dense breast tissue but no other mammographic findings of concern.   Patient has been doing well for past year, with no complaints that seem referable to breast cancer history. She had appendectomy 03-2013 by Dr Marcello Moores. She has not had significant respiratory infection in over a year. She denies new or different pain, any bleeding, GI or other respiratory concerns. She is not aware of any changes in right breast or left breast reconstruction.Energy and appetite are good. She continues to work and is active at home.  She does not have central catheter. I do not believe that she has had genetics testing. She has not had hysterectomy or oophorectomy. Bone density scan was ordered 2013, but not done at least in [facility] system   ONCOLOGIC HISTORY T1N1 (one of nine axillary nodes +) diagnosed 1998 at age 38, treated with mastectomy with axillary node evaluation, TRAM reconstruction, adjuvant adriamycin, cytoxan and taxol chemotherapy then 5 years of tamoxifen thru 11-2002.   Review of systems as above, also: No fever or symptoms of infection. No LE swelling.  Remainder of 10 point Review of Systems negative.  Objective:  Vital signs in last 24 hours:  BP 125/87  Pulse 61  Temp(Src) 97.8 F (36.6 C) (Oral)  Resp 18  Ht 5\' 4"  (1.626 m)  Wt 147 lb 11.2 oz (66.996 kg)  BMI 25.34  kg/m2  Alert, oriented and appropriate, looks comfortable. Ambulatory without difficulty.    HEENT:PERRL, sclerae not icteric. Oral mucosa moist without lesions, posterior pharynx clear.  Neck supple. No JVD.  Lymphatics:no cervical,supraclavicular, axillary or inguinal adenopathy Resp: clear to auscultation bilaterally and normal percussion bilaterally Cardio: regular rate and rhythm. No gallop. GI: soft, nontender, not distended, no mass or organomegaly. Normally active bowel sounds. Surgical incision not remarkable. Musculoskeletal/ Extremities: without pitting edema, cords, tenderness UE or LE Neuro: nonfocal PSYCH appropriate mood and affect Skin without rash, ecchymosis, petechiae Breasts: Right without dominant mass, skin or nipple findings. Left reconstructed breast without findings of concern for local recurrence. Axillae benign.   Lab Results:  Results for orders placed in visit on 05/24/14  CBC WITH DIFFERENTIAL      Result Value Ref Range   WBC 3.6 (*) 3.9 - 10.3 10e3/uL   NEUT# 1.8  1.5 - 6.5 10e3/uL   HGB 13.0  11.6 - 15.9 g/dL   HCT 39.2  34.8 - 46.6 %   Platelets 219  145 - 400 10e3/uL   MCV 95.4  79.5 - 101.0 fL   MCH 31.7  25.1 - 34.0 pg   MCHC 33.2  31.5 - 36.0 g/dL   RBC 4.11  3.70 - 5.45 10e6/uL   RDW 12.6  11.2 - 14.5 %   lymph# 1.2  0.9 - 3.3 10e3/uL   MONO# 0.5  0.1 - 0.9 10e3/uL   Eosinophils Absolute 0.1  0.0 - 0.5 10e3/uL   Basophils Absolute 0.0  0.0 - 0.1  10e3/uL   NEUT% 48.9  38.4 - 76.8 %   LYMPH% 34.5  14.0 - 49.7 %   MONO% 12.8  0.0 - 14.0 %   EOS% 2.9  0.0 - 7.0 %   BASO% 0.9  0.0 - 2.0 %    WBC/ANC was higher 03-2013 with acute appendicitis, otherwise in this range previously  CMET available after visit entirely normal  Studies/Results: DIGITAL SCREENING UNILATERAL RIGHT MAMMOGRAM WITH CAD   05-08-14 COMPARISON: Previous exam(s)  ACR Breast Density Category c: The breast tissue is heterogeneously  dense, which may obscure small  masses.  FINDINGS:  The patient has had a left mastectomy. There are no findings  suspicious for malignancy. Images were processed with CAD.  IMPRESSION:  No mammographic evidence of malignancy. A result letter of this  screening mammogram will be mailed directly to the patient.  RECOMMENDATION:  Screening mammogram in one year. (Code:SM-B-01Y)  BI-RADS CATEGORY 1: Negative.  NOTE she did have tomo mammography with mammogram in 2014, however her insurance would not cover that. I have explained that the 3D tomo imaging is optimal with dense breast tissue and encouraged her to do this with next mammograms if possible.  Medications: I have reviewed the patient's current medications.  DISCUSSION: depending on other physician follow up, may change medical oncology to prn, but with history still also ok to follow here with labs yearly.  Assessment/Plan: 1.T1N1 left breast carcinoma diagnosed 1998 at age 29 and premenopausal: that pathology and other information did not transfer into this EMR, predated first medical oncology EMR and likely is in microfilm. I will ask HIM to locate chemo records and path information so that it can be available in this EMR in case needed in future. I will see her back with CBC diff and chemistries in one year depending on other MD follow up. Mammograms yearly as above 2. Appendectomy 03-2013 for acute appendicitis 3.allergic respiratory symptoms 4.flu vaccine done 05-23-2014      Dominick Morella P, MD   05/24/2014, 1:51 PM

## 2014-05-25 ENCOUNTER — Telehealth: Payer: Self-pay | Admitting: *Deleted

## 2014-05-25 NOTE — Telephone Encounter (Signed)
Message copied by Christa See on Thu May 25, 2014  9:46 AM ------      Message from: Gordy Levan      Created: Wed May 24, 2014  4:52 PM       Labs seen and need follow up: please let her know that chemistries were all fine, with normal renal function ------

## 2014-05-25 NOTE — Telephone Encounter (Deleted)
Message copied by Christa See on Thu May 25, 2014  9:47 AM ------      Message from: Gordy Levan      Created: Wed May 24, 2014  4:52 PM       Labs seen and need follow up: please let her know that chemistries were all fine, with normal renal function ------

## 2014-05-25 NOTE — Telephone Encounter (Signed)
Called patient as noted below by Dr. Marko Plume and let her know CMET came back fine and her renal function is normal. Patient very appreciative of the call.

## 2014-07-17 ENCOUNTER — Encounter: Payer: Self-pay | Admitting: Internal Medicine

## 2014-07-25 IMAGING — CT CT ABD-PELV W/ CM
2 of 5 series · 16 of 46 positions shown, 18 images · IV contrast (APPLIED)
Comparison: None.

CLINICAL DATA: Abdominal pain.  History of breast cancer.

CT ABDOMEN AND PELVIS WITH CONTRAST
TECHNIQUE: Multidetector CT imaging of the abdomen and pelvis was
performed following the standard protocol during bolus
administration of intravenous contrast.
Contrast: 50mL OMNIPAQUE IOHEXOL 300 MG/ML  SOLN, 100mL OMNIPAQUE
IOHEXOL 300 MG/ML  SOLN

[Series 2: abd/pelvis 5.0 b31f · axial · 0.81mm/px · z∈[-413,+2]mm · 13 of 93 slices shown, 15 images]
[im 5/93  soft-tissue]
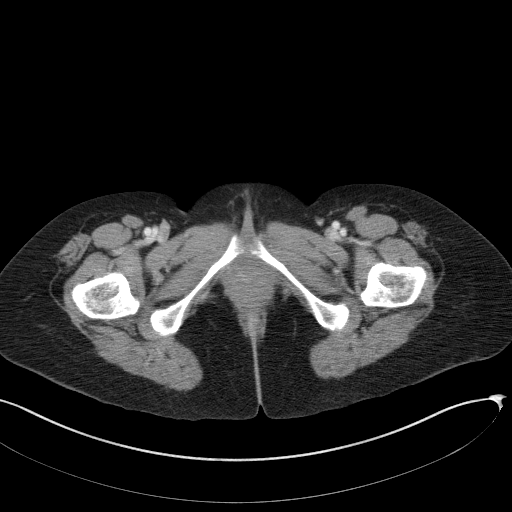
[im 5/93  bone]
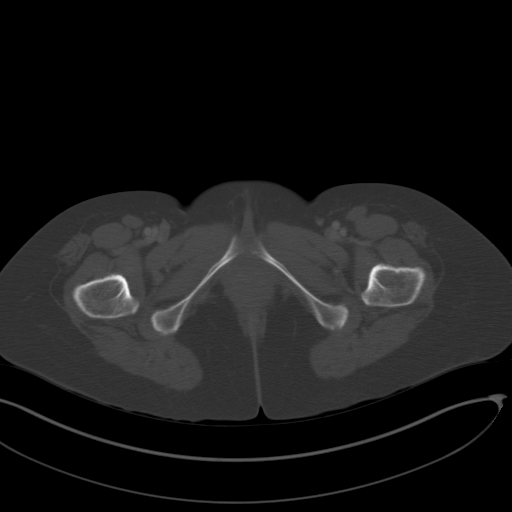
[im 14/93  soft-tissue]
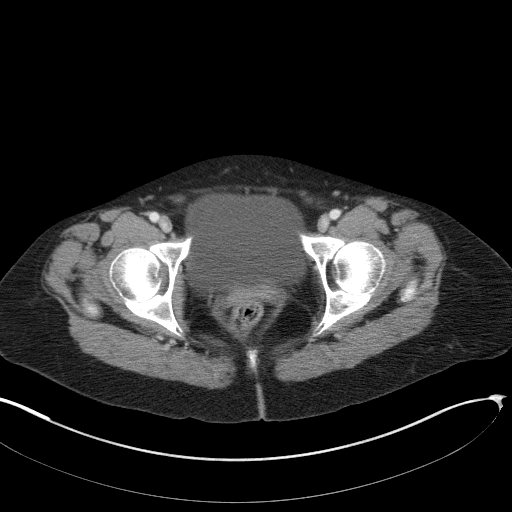
[im 19/93  soft-tissue]
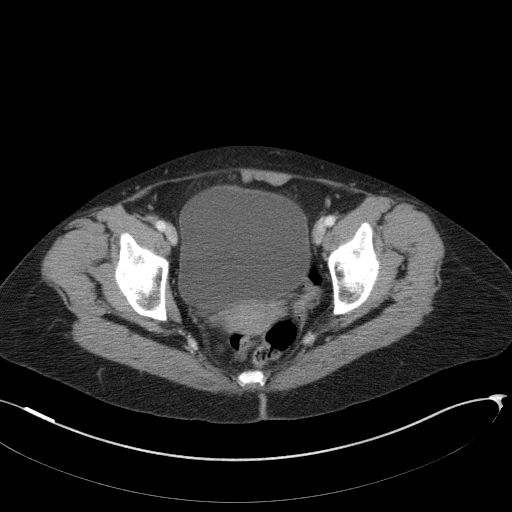
[im 28/93  soft-tissue]
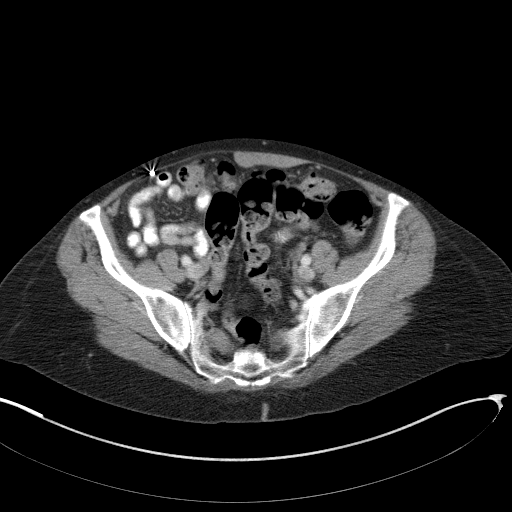
[im 33/93  soft-tissue]
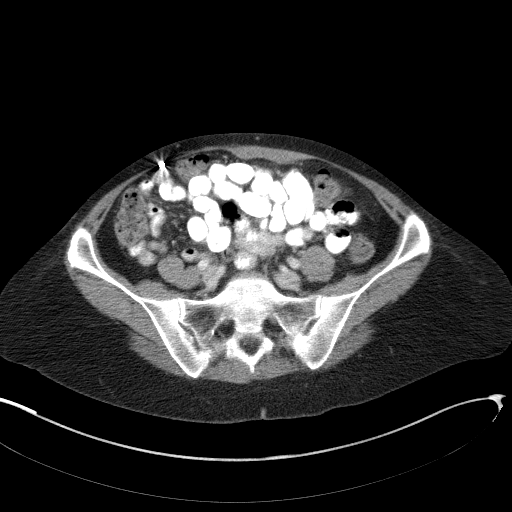
[im 42/93  soft-tissue]
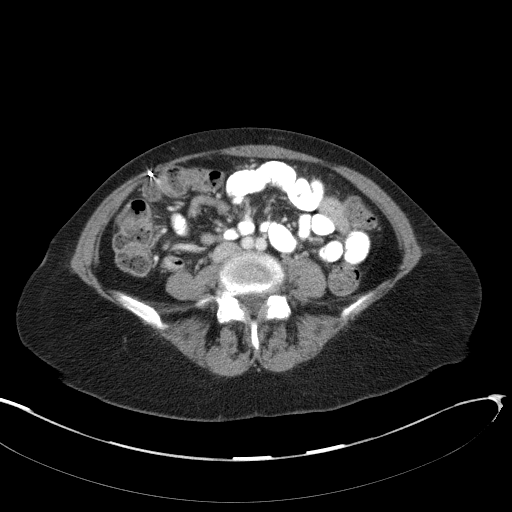
[im 47/93  soft-tissue]
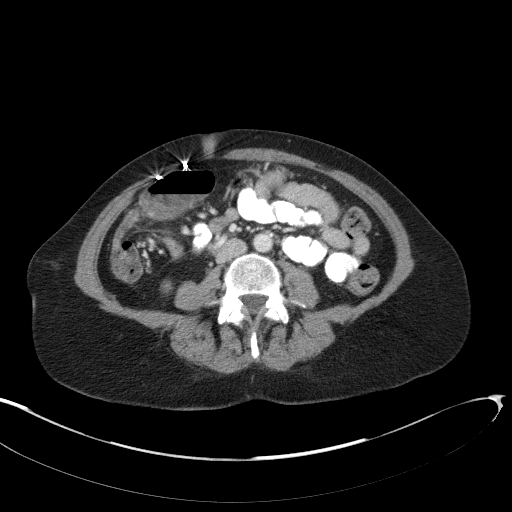
[im 51/93  soft-tissue]
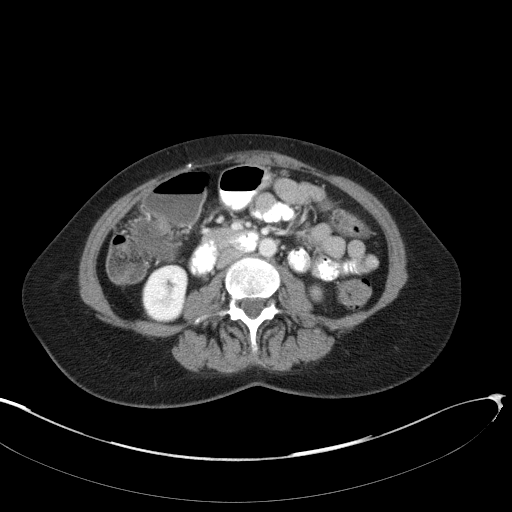
[im 60/93  soft-tissue]
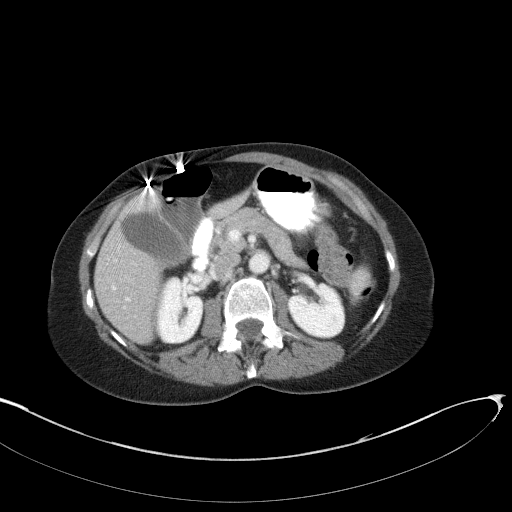
[im 60/93  bone]
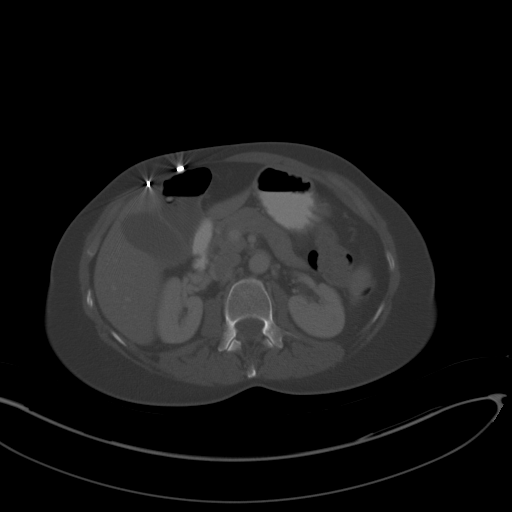
[im 65/93  soft-tissue]
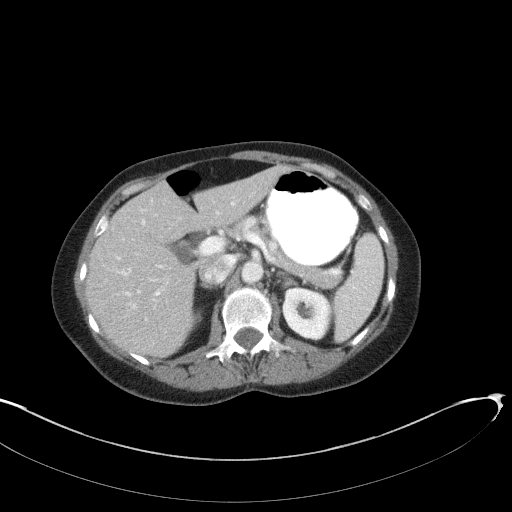
[im 74/93  soft-tissue]
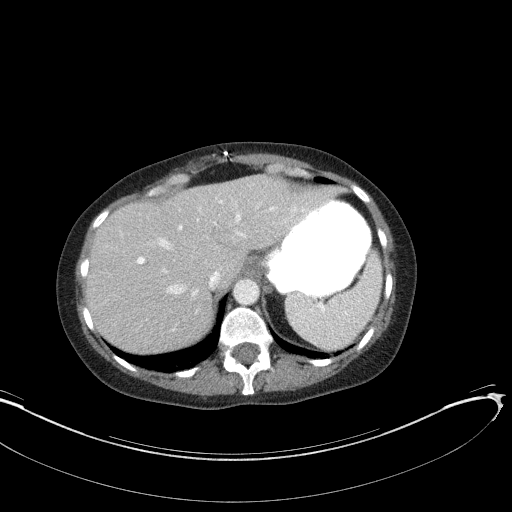
[im 79/93  soft-tissue]
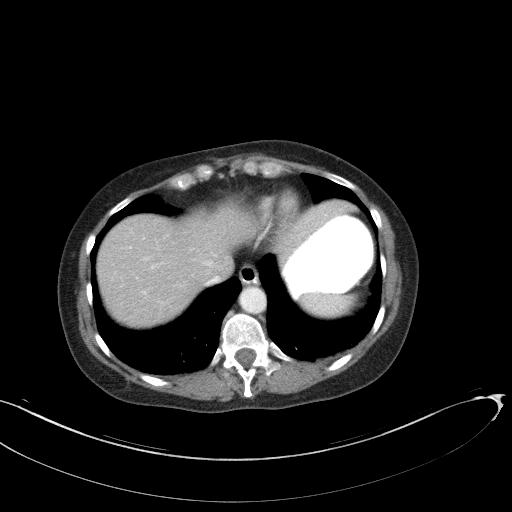
[im 88/93  soft-tissue]
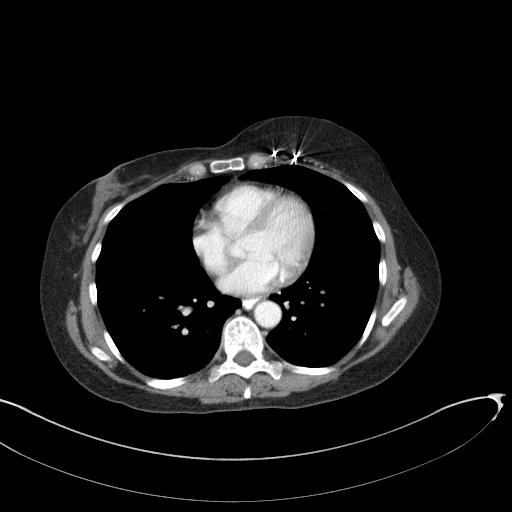

[Series 5: abd/pelvis 3.0 coronal · coronal · 0.95mm/px · 3 of 69 slices shown]
[im 23/69  soft-tissue]
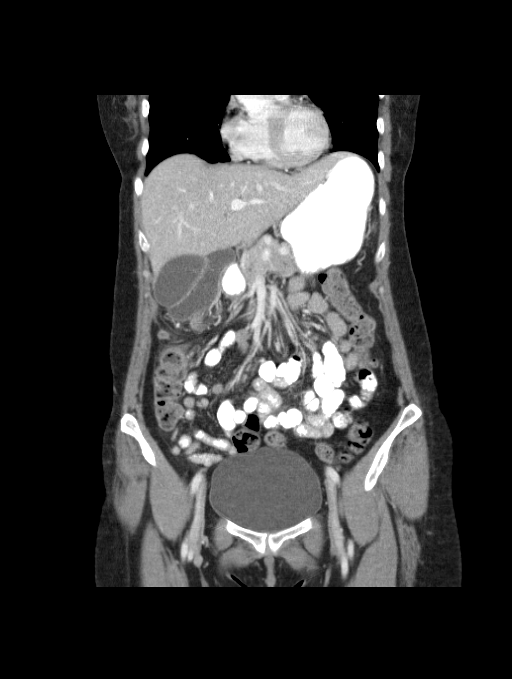
[im 31/69  soft-tissue]
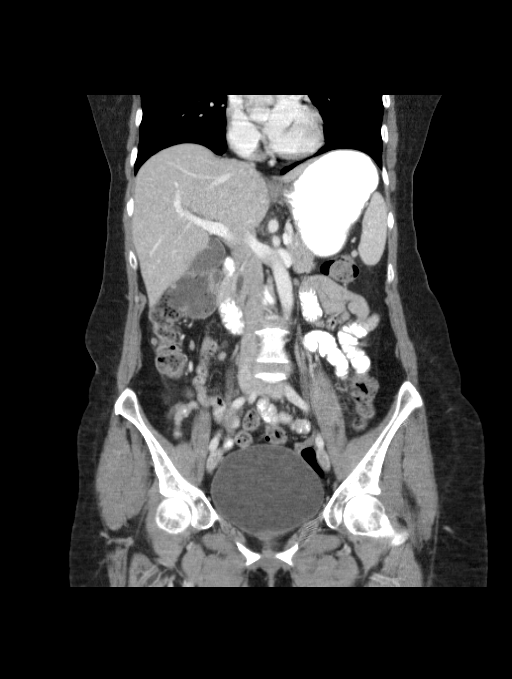
[im 38/69  soft-tissue]
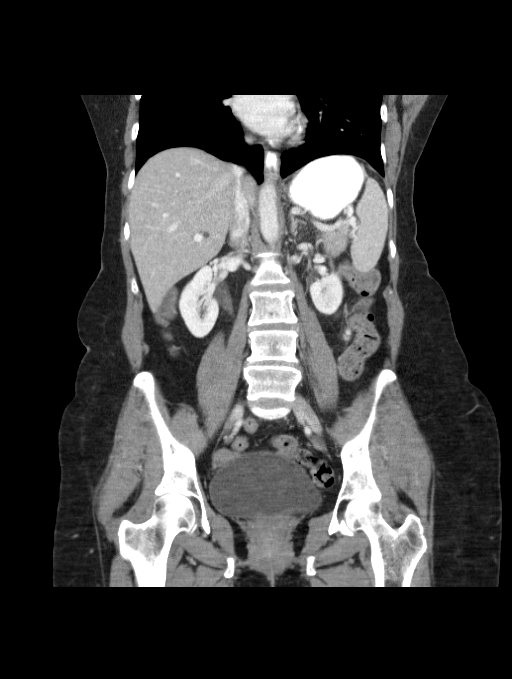

[16 of 46 positions shown; findings below may reference images not displayed]

FINDINGS: The lung bases are clear.  There is no pleural or
pericardial effusion.  Postoperative change left breast is
incidentally noted.

The gallbladder, spleen, adrenal glands, pancreas and kidneys
appear normal.  Small hepatic cyst in the right lobe is identified
on image 27.

The appendix has a straight configuration with enhancement of its
wall and some periappendiceal inflammatory change.  A tiny
appendicolith is seen at the appendiceal orifice into the cecum.
Note is made that the patient's cecum is abnormally positioned in
the right upper quadrant and the appendix is at the level of the
mid ascending colon extending lateral to the colon.  No fluid
collection or evidence of perforation is identified.  The stomach
and small and large bowel appear normal.  No focal bony abnormality
is identified. Mesh from prior radio there is noted.
IMPRESSION: The study is positive for acute appendicitis without abscess or
perforation. Right upper quadrant position of the cecum with
associated atypical position of the appendix again noted.

## 2014-10-19 ENCOUNTER — Ambulatory Visit: Payer: Managed Care, Other (non HMO) | Admitting: Audiology

## 2014-11-07 ENCOUNTER — Ambulatory Visit (INDEPENDENT_AMBULATORY_CARE_PROVIDER_SITE_OTHER): Payer: BLUE CROSS/BLUE SHIELD

## 2014-11-07 DIAGNOSIS — J309 Allergic rhinitis, unspecified: Secondary | ICD-10-CM

## 2014-11-30 ENCOUNTER — Telehealth: Payer: Self-pay | Admitting: Internal Medicine

## 2014-11-30 MED ORDER — AMOXICILLIN-POT CLAVULANATE 875-125 MG PO TABS: 1.0000 | ORAL_TABLET | Freq: Two times a day (BID) | ORAL | Status: DC

## 2014-11-30 NOTE — Telephone Encounter (Signed)
Offer augmentin 875 m, # 14, 1 twice daily

## 2014-11-30 NOTE — Telephone Encounter (Signed)
Rx has been sent in per CY. Pt is aware. Nothing further was needed. 

## 2014-11-30 NOTE — Telephone Encounter (Signed)
Spoke with pt. States that she has a sinus infection. Reports coughing and sinus pressure. Mucus is dark green in color. Onset was 4 days ago. Would like something called in.  No Known Allergies  CY - please advise. Thanks.

## 2014-12-27 ENCOUNTER — Other Ambulatory Visit: Payer: Self-pay | Admitting: Obstetrics and Gynecology

## 2014-12-27 ENCOUNTER — Other Ambulatory Visit (HOSPITAL_COMMUNITY)
Admission: RE | Admit: 2014-12-27 | Discharge: 2014-12-27 | Disposition: A | Discharge status: Home / Self Care | Payer: BLUE CROSS/BLUE SHIELD | Source: Ambulatory Visit | Attending: Obstetrics and Gynecology | Admitting: Obstetrics and Gynecology

## 2014-12-27 DIAGNOSIS — Z01419 Encounter for gynecological examination (general) (routine) without abnormal findings: Secondary | ICD-10-CM | POA: Insufficient documentation

## 2014-12-29 LAB — CYTOLOGY - PAP

## 2015-01-26 ENCOUNTER — Telehealth: Payer: Self-pay | Admitting: Internal Medicine

## 2015-01-26 MED ORDER — AMOXICILLIN-POT CLAVULANATE 875-125 MG PO TABS: 1.0000 | ORAL_TABLET | Freq: Two times a day (BID) | ORAL | Status: DC

## 2015-01-26 NOTE — Telephone Encounter (Signed)
Pt c/o teeth, ear, and headache since Wednesday. Pt having sinus congestion, PND.  Denies fever, chest congestion/pain. Pt has taken zyrtec and claritin with no relief.   Pt uses [facility] on [address] and [address].    Last ov: 05/23/14 Next ov: 05/24/15  CY please advise.  Thanks!  No Known Allergies Current Outpatient Prescriptions on File Prior to Visit  Medication Sig Dispense Refill  . albuterol (PROAIR HFA) 108 (90 BASE) MCG/ACT inhaler Inhale 2 puffs into the lungs 4 (four) times daily as needed for wheezing or shortness of breath. 1 Inhaler 2  . ALPRAZolam (XANAX) 0.5 MG tablet Take 0.5 mg by mouth at bedtime as needed.      Marland Kitchen amoxicillin-clavulanate (AUGMENTIN) 875-125 MG per tablet Take 1 tablet by mouth 2 (two) times daily. 14 tablet 0  . buPROPion (WELLBUTRIN XL) 150 MG 24 hr tablet Take 150 mg by mouth daily.    . Cholecalciferol (VITAMIN D-3) 5000 UNITS TABS Take 1 tablet three times a week    . DULoxetine (CYMBALTA) 30 MG capsule Take 30 mg by mouth 3 (three) times daily.     . fluticasone (FLONASE) 50 MCG/ACT nasal spray 1-2 sprays each nostril at bedtime 16 g prn  . magnesium oxide (MAG-OX) 400 MG tablet Take 400 mg by mouth 2 (two) times daily.    . SUMAtriptan (IMITREX) 50 MG tablet Take 50 mg by mouth every 2 (two) hours as needed for migraine.    Marland Kitchen tiZANidine (ZANAFLEX) 4 MG tablet Take 4 mg by mouth every 6 (six) hours as needed for muscle spasms.     No current facility-administered medications on file prior to visit.

## 2015-01-26 NOTE — Telephone Encounter (Signed)
Spoke with the pt and notified of recs per CDY  She verbalized understanding and nothing further needed  Rx was sent to pharm

## 2015-01-26 NOTE — Telephone Encounter (Signed)
Offer augmentin 875, # 14, ref x 1    1 twice daily  Suggest Mucinex-D from pharmacy counter for decongestant

## 2015-02-07 ENCOUNTER — Encounter: Payer: Self-pay | Admitting: Internal Medicine

## 2015-02-07 ENCOUNTER — Ambulatory Visit (INDEPENDENT_AMBULATORY_CARE_PROVIDER_SITE_OTHER): Payer: BLUE CROSS/BLUE SHIELD | Admitting: Internal Medicine

## 2015-02-07 ENCOUNTER — Telehealth: Payer: Self-pay | Admitting: Internal Medicine

## 2015-02-07 VITALS — BP 102/64 | HR 93 | Ht 64.0 in | Wt 148.0 lb

## 2015-02-07 DIAGNOSIS — J0100 Acute maxillary sinusitis, unspecified: Secondary | ICD-10-CM

## 2015-02-07 DIAGNOSIS — J309 Allergic rhinitis, unspecified: Secondary | ICD-10-CM | POA: Diagnosis not present

## 2015-02-07 MED ORDER — METHYLPREDNISOLONE ACETATE 40 MG/ML IJ SUSP
80.0000 mg | Freq: Once | INTRAMUSCULAR | Status: AC
  Administered 2015-02-07: 80 mg via INTRAMUSCULAR

## 2015-02-07 NOTE — Telephone Encounter (Signed)
Allergic rhinitis patient of CY's Last seen 9.15.15, recs to follow up in 1 year LMOM TCB x1 for pt

## 2015-02-07 NOTE — Assessment & Plan Note (Signed)
Question if this is acute frontal sinusitis, possibly with seasonal allergic trigger. Plan= neb nasal neo, depo 80. Consider limited CT of sinuses if necessary.

## 2015-02-07 NOTE — Telephone Encounter (Signed)
Pt returned call Having constant headaches with some dizziness/lightheadedness that are turning in migraines Was rx'd Augmentin on 5.20.16 x7 days, with no relief.  Doing nasal rinses, no sign of infection.  Still having sinus pressure that does improve with Vicks Vapor Rub on her head and cold compress but HA always returns. Would a cortisone shot help?  Office visit to be seen?  CY with no openings today; TP not in office  Dr Annamaria Boots please advise, thank you.

## 2015-02-07 NOTE — Progress Notes (Signed)
Patient ID: NIKIE CID, female   DOB: [birth year], 54 y.o.   MRN: [id number]  Asthma Her past medical history is significant for asthma.   01/21/11- 54 yoF never smoker followed for allergic rhinitis, asthma, complicated by hx breast cancer/ mastectomy.  Last here Jun 27, 2010 Had done well through winter and spring till she noted onset of nasal drainage and some chest tightness while out shopping. No fever, sore throat or discolored sputum. Went to UC 4 days ago. Told to take allegra. Now chest feels well. Head still draining. A little cough, no wheeze. Light headed. Onset with some headache, not now. Has not had problem with her allergy shots still at 1:10.   05/16/11- 54 yoF never smoker followed for allergic rhinitis, asthma, complicated by hx breast cancer/ mastectomy.  Acute visit-"major attack" second this year- nasal congestion, clear mucus. Cough at night. Denies fever, sore throat. Using Allegra-D . Does do saline nasal rinse.   09/17/11- 54 yoF never smoker followed for allergic rhinitis, asthma, complicated by hx breast cancer/ mastectomy.  Acute visit; sinus headache and excessive drainage x 2 months-not seeing any color to the drainage now Frontal headache and maxillary sinus pressure since Thanksgiving. Took amoxicillin then with temporary help. No discharge now. Taking Mucinex D alternating with Allegra-D. Denies any discomfort in her ears or chest.  05/21/12- 51 yoF never smoker followed for allergic rhinitis, asthma, complicated by hx breast cancer/ mastectomy Doing well on vaccine 1:10 GO and doing well with it; slight flare up with allergies few weeks ago but back to normal now. Has not needed inhaler in 2 years. No recent sinus infection. Uses Allegra-D.  05/23/13- 52 yoF never smoker followed for allergic rhinitis, asthma, complicated by hx breast cancer/ mastectomy FOLLOWS FOR: Allergies improved since last OV. No complaints or concern today Allergy vaccine 1:10 GO Better  year with les need for benadryl. Likes Astelin nasal spray.  No need for rescue inhaler in 2 years- no major respiratory infections.  05/23/14- 54 yoF never smoker followed for allergic rhinitis, asthma, complicated by hx breast cancer/ mastectomy FOLLOWS FOR: Pt states she had a "bad allergy spell" in last week of August. Pt states she had drainage that progressed into a chest cold, but has now resolved. Pt states taking the flonase and vit C is helping. Pt taking allergy vaccine1:10 GO and she is doing well with it.   02/07/15- 54 yoF never smoker followed for allergic rhinitis, asthma, complicated by hx breast cancer/ mastectomy Reports: pt. is having allergy headaches, OTC mucinex D, headache benyadryl, antibiotics not helping Continues allergy vaccine 1:10 She called asking for work-in and hoping for a steroid shot to see if it would help frontal headache she attributes to sinus pain. Blows or saline lavages only clear mucus now. This episode began 7-10 days ago with sneeze and itch- used Flonase . Worse if leans over. Augmentin x 7 days helped clear mucus. Avoids Mucinex-D which makes head pain worse.   Review of Systems- see HPI Constitutional:   No weight loss, night sweats,  , chills, fatigue, lassitude. HEENT:   + headaches,  No- Difficulty swallowing,  Tooth/dental problems,  Sore throat,  CV:  No chest pain,  Orthopnea, PND, swelling in lower extremities, anasarca, dizziness, palpitations GI  No heartburn, indigestion, abdominal pain, nausea, vomiting,  Resp: No shortness of breath with exertion or at rest.  No excess mucus, no productive cough,  no- non-productive cough,  No coughing up of blood.  No change in color of mucus.  Skin: no rash or lesions. GU: . MS:  No joint pain or swelling.   Psych:  No change in mood or affect. No depression or anxiety.  No memory loss.   Objective:   Physical Exam General- Alert, Oriented, Affect-appropriate, Distress- none acute Skin-  rash-none, lesions- none, excoriation- none Lymphadenopathy- none Head- atraumatic            Eyes- Gross vision intact, PERRLA, conjunctivae clear secretions            Ears- Hearing, canals-normal            Nose- turbinate edema mild+thick white mucus,  no-Septal dev, mucus, polyps, erosion, perforation             Throat- Mallampati II , mucosa clear , drainage- none, tonsils- atrophic Neck- flexible , trachea midline, no stridor , thyroid nl, carotid no bruit Chest - symmetrical excursion , unlabored           Heart/CV- RRR , no murmur , no gallop  , no rub, nl s1 s2                           - JVD- none , edema- none, stasis changes- none, varices- none           Lung- clear to P&A, wheeze- none, cough- none , dullness-none, rub- none           Chest wall-  L mastectomy Abd- Br/ Gen/ Rectal- Not done, not indicated Extrem- cyanosis- none, clubbing, none, atrophy- none, strength- nl Neuro- grossly intact to observation

## 2015-02-07 NOTE — Telephone Encounter (Signed)
I contacted patient-she will come in today to see CY at 11:00am; front staff aware for insurance purposes and nothing more needed at this time.

## 2015-02-07 NOTE — Patient Instructions (Addendum)
Neb neo nasal   Dx frontal sinusitis, acute  Depo 80  Please call as needed  Keep scheduled appointment

## 2015-02-08 MED ORDER — PHENYLEPHRINE HCL 1 % NA SOLN
3.0000 [drp] | Freq: Once | NASAL | Status: AC
  Administered 2015-02-08: 3 [drp] via NASAL

## 2015-02-08 NOTE — Addendum Note (Signed)
Addended by: Lorane Gell on: 02/08/2015 09:45 AM   Modules accepted: Orders

## 2015-04-29 ENCOUNTER — Encounter (HOSPITAL_BASED_OUTPATIENT_CLINIC_OR_DEPARTMENT_OTHER): Payer: Self-pay

## 2015-04-29 ENCOUNTER — Emergency Department (HOSPITAL_BASED_OUTPATIENT_CLINIC_OR_DEPARTMENT_OTHER)
Admission: EM | Admit: 2015-04-29 | Discharge: 2015-04-29 | Disposition: A | Discharge status: Home / Self Care | Payer: BLUE CROSS/BLUE SHIELD | Attending: Emergency Medicine | Admitting: Emergency Medicine

## 2015-04-29 DIAGNOSIS — Z79899 Other long term (current) drug therapy: Secondary | ICD-10-CM | POA: Diagnosis not present

## 2015-04-29 DIAGNOSIS — J45909 Unspecified asthma, uncomplicated: Secondary | ICD-10-CM | POA: Insufficient documentation

## 2015-04-29 DIAGNOSIS — Z7951 Long term (current) use of inhaled steroids: Secondary | ICD-10-CM | POA: Diagnosis not present

## 2015-04-29 DIAGNOSIS — G43809 Other migraine, not intractable, without status migrainosus: Secondary | ICD-10-CM | POA: Insufficient documentation

## 2015-04-29 DIAGNOSIS — G43909 Migraine, unspecified, not intractable, without status migrainosus: Secondary | ICD-10-CM | POA: Diagnosis present

## 2015-04-29 DIAGNOSIS — Z853 Personal history of malignant neoplasm of breast: Secondary | ICD-10-CM | POA: Insufficient documentation

## 2015-04-29 HISTORY — DX: Migraine, unspecified, not intractable, without status migrainosus: G43.909

## 2015-04-29 MED ORDER — SUMATRIPTAN SUCCINATE 100 MG PO TABS: 100.0000 mg | ORAL_TABLET | ORAL | Status: DC | PRN

## 2015-04-29 MED ORDER — SODIUM CHLORIDE 0.9 % IV BOLUS (SEPSIS)
1000.0000 mL | Freq: Once | INTRAVENOUS | Status: AC
  Administered 2015-04-29: 1000 mL via INTRAVENOUS

## 2015-04-29 MED ORDER — DIPHENHYDRAMINE HCL 50 MG/ML IJ SOLN
25.0000 mg | Freq: Once | INTRAMUSCULAR | Status: AC
  Administered 2015-04-29: 25 mg via INTRAVENOUS
  Filled 2015-04-29: qty 1

## 2015-04-29 MED ORDER — KETOROLAC TROMETHAMINE 30 MG/ML IJ SOLN
30.0000 mg | Freq: Once | INTRAMUSCULAR | Status: AC
  Administered 2015-04-29: 30 mg via INTRAVENOUS
  Filled 2015-04-29: qty 1

## 2015-04-29 MED ORDER — METOCLOPRAMIDE HCL 5 MG/ML IJ SOLN
10.0000 mg | Freq: Once | INTRAMUSCULAR | Status: AC
  Administered 2015-04-29: 10 mg via INTRAVENOUS
  Filled 2015-04-29: qty 2

## 2015-04-29 MED ORDER — DEXAMETHASONE SODIUM PHOSPHATE 10 MG/ML IJ SOLN
10.0000 mg | Freq: Once | INTRAMUSCULAR | Status: AC
  Administered 2015-04-29: 10 mg via INTRAVENOUS
  Filled 2015-04-29: qty 1

## 2015-04-29 NOTE — ED Provider Notes (Signed)
CSN: [id number]   Arrival date & time 04/29/15 1454  History  This chart was scribed for Veryl Speak, MD by Altamease Oiler, ED Scribe. This patient was seen in room MH03/MH03 and the patient's care was started at 4:03 PM.  Chief Complaint  Patient presents with  . Migraine    HPI The history is provided by the patient. No language interpreter was used.   [name] is a 54 y.o. female with PMHx of migraines who presents to the Emergency Department complaining of constant generalized headache with gradual onset 1 week ago. The pain waxes and wanes in intensity and is more severe that her usual migraines. She usually uses 100 mg Imitrex and 500 mg naproxen for her migraines but recently ran out of medication. The ten 50 mg tablets of Imitrex and muscle relaxer that she was prescribed by her new doctor have been providing insufficient pain relief at home and she is almost out.  Associated symptoms include photophobia, sonophobia, neck pain, and nausea. Pt denies fever, vomiting, weakness, numbness, or tingling.  Past Medical History  Diagnosis Date  . Unspecified asthma(493.90)   . Allergic rhinitis, cause unspecified   . Breast cancer     chemo; left  . Migraine     Past Surgical History  Procedure Laterality Date  . Mastectomy      left; chemo  . Sinus surgery with instatrak    . Laparoscopic appendectomy N/A 03/27/2013    Procedure: APPENDECTOMY LAPAROSCOPIC;  Surgeon: Leighton Ruff, MD;  Location: [facility];  Service: General;  Laterality: N/A;    Family History  Problem Relation Age of Onset  . Other Father     Lambert-Eaton neuromuscular degenerative disease    Social History  Substance Use Topics  . Smoking status: Never Smoker   . Smokeless tobacco: None  . Alcohol Use: No     Review of Systems 10 Systems reviewed and all are negative for acute change except as noted in the HPI.  Home Medications   Prior to Admission medications   Medication Sig Start Date End  Date Taking? Authorizing Provider  albuterol (PROAIR HFA) 108 (90 BASE) MCG/ACT inhaler Inhale 2 puffs into the lungs 4 (four) times daily as needed for wheezing or shortness of breath. 08/23/13 01/21/15  Deneise Lever, MD  ALPRAZolam Duanne Moron) 0.5 MG tablet Take 0.5 mg by mouth at bedtime as needed.      Historical Provider, MD  buPROPion (WELLBUTRIN XL) 150 MG 24 hr tablet Take 300 mg by mouth daily.     Historical Provider, MD  Cholecalciferol (VITAMIN D-3) 5000 UNITS TABS Take 1 tablet three times a week    Historical Provider, MD  fluticasone (FLONASE) 50 MCG/ACT nasal spray 1-2 sprays each nostril at bedtime 11/19/12   Deneise Lever, MD  magnesium oxide (MAG-OX) 400 MG tablet Take 400 mg by mouth 2 (two) times daily.    Historical Provider, MD  SUMAtriptan (IMITREX) 50 MG tablet Take 50 mg by mouth every 2 (two) hours as needed for migraine.    Historical Provider, MD  tiZANidine (ZANAFLEX) 4 MG tablet Take 4 mg by mouth every 6 (six) hours as needed for muscle spasms.    Historical Provider, MD    Allergies  Review of patient's allergies indicates no known allergies.  Triage Vitals: BP 118/81 mmHg  Pulse 93  Temp(Src) 97.7 F (36.5 C) (Oral)  Resp 18  Ht 5\' 4"  (1.626 m)  Wt 147 lb (66.679 kg)  BMI 25.22 kg/m2  SpO2 98%  Physical Exam  Constitutional: She is oriented to person, place, and time. She appears well-developed and well-nourished.  HENT:  Head: Normocephalic.  Eyes: EOM are normal. Pupils are equal, round, and reactive to light.  No papilledema on fundoscopic exam   Neck: Normal range of motion.  Pulmonary/Chest: Effort normal.  Abdominal: She exhibits no distension.  Musculoskeletal: Normal range of motion.  Neurological: She is alert and oriented to person, place, and time. No cranial nerve deficit. She exhibits normal muscle tone. Coordination normal.  Psychiatric: She has a normal mood and affect.  Nursing note and vitals reviewed.    ED Course   Procedures  DIAGNOSTIC STUDIES: Oxygen Saturation is 98% on RA, normal by my interpretation.    COORDINATION OF CARE: 4:08 PM Discussed treatment plan which includes a migraine cocktail with pt at bedside and pt agreed to plan.  5:34 PM I re-evaluated the patient and her pain has improved after the migraine cocktail.    Labs Reviewed - No data to display  Imaging Review No results found.  EKG Interpretation None     MDM   Final diagnoses:  None     Patient presents with complaints of migraine headache. She was given a migraine cocktail with complete resolution. She is neurologically intact and there is no fever or nuchal rigidity. I see no reason for LP or further imaging. She is to return if symptoms significantly worsen or change. She normally takes Imitrex and she is nearly out of this. I've prescribed several more tablet she can take if her headache returns.   I personally performed the services described in this documentation, which was scribed in my presence. The recorded information has been reviewed and is accurate.    Veryl Speak, MD 04/29/15 [phone]

## 2015-04-29 NOTE — ED Notes (Signed)
Warm blanket and lights dimmed to aid in comfort

## 2015-04-29 NOTE — ED Notes (Signed)
Pt states when pain is worse has light sensitivity and nausea

## 2015-04-29 NOTE — Discharge Instructions (Signed)
Imitrex as prescribed.  Follow-up with your primary Dr. if not improving or if headaches persist.   Migraine Headache A migraine headache is an intense, throbbing pain on one or both sides of your head. A migraine can last for 30 minutes to several hours. CAUSES  The exact cause of a migraine headache is not always known. However, a migraine may be caused when nerves in the brain become irritated and release chemicals that cause inflammation. This causes pain. Certain things may also trigger migraines, such as:  Alcohol.  Smoking.  Stress.  Menstruation.  Aged cheeses.  Foods or drinks that contain nitrates, glutamate, aspartame, or tyramine.  Lack of sleep.  Chocolate.  Caffeine.  Hunger.  Physical exertion.  Fatigue.  Medicines used to treat chest pain (nitroglycerine), birth control pills, estrogen, and some blood pressure medicines. SIGNS AND SYMPTOMS  Pain on one or both sides of your head.  Pulsating or throbbing pain.  Severe pain that prevents daily activities.  Pain that is aggravated by any physical activity.  Nausea, vomiting, or both.  Dizziness.  Pain with exposure to bright lights, loud noises, or activity.  General sensitivity to bright lights, loud noises, or smells. Before you get a migraine, you may get warning signs that a migraine is coming (aura). An aura may include:  Seeing flashing lights.  Seeing bright spots, halos, or zigzag lines.  Having tunnel vision or blurred vision.  Having feelings of numbness or tingling.  Having trouble talking.  Having muscle weakness. DIAGNOSIS  A migraine headache is often diagnosed based on:  Symptoms.  Physical exam.  A CT scan or MRI of your head. These imaging tests cannot diagnose migraines, but they can help rule out other causes of headaches. TREATMENT Medicines may be given for pain and nausea. Medicines can also be given to help prevent recurrent migraines.  HOME CARE  INSTRUCTIONS  Only take over-the-counter or prescription medicines for pain or discomfort as directed by your health care provider. The use of long-term narcotics is not recommended.  Lie down in a dark, quiet room when you have a migraine.  Keep a journal to find out what may trigger your migraine headaches. For example, write down:  What you eat and drink.  How much sleep you get.  Any change to your diet or medicines.  Limit alcohol consumption.  Quit smoking if you smoke.  Get 7-9 hours of sleep, or as recommended by your health care provider.  Limit stress.  Keep lights dim if bright lights bother you and make your migraines worse. SEEK IMMEDIATE MEDICAL CARE IF:   Your migraine becomes severe.  You have a fever.  You have a stiff neck.  You have vision loss.  You have muscular weakness or loss of muscle control.  You start losing your balance or have trouble walking.  You feel faint or pass out.  You have severe symptoms that are different from your first symptoms. MAKE SURE YOU:   Understand these instructions.  Will watch your condition.  Will get help right away if you are not doing well or get worse. Document Released: 08/25/2005 Document Revised: 01/09/2014 Document Reviewed: 05/02/2013 [facility] Patient Information 2015 [place], [place]. This information is not intended to replace advice given to you by your health care provider. Make sure you discuss any questions you have with your health care provider.

## 2015-04-29 NOTE — ED Notes (Signed)
Family at bedside. 

## 2015-04-29 NOTE — ED Notes (Signed)
Patient here with migraine headache x 1 week, reports lots of muscle neck tension with same. Nausea with same, has been seeing chiropractor for same

## 2015-05-03 ENCOUNTER — Encounter: Payer: Self-pay | Admitting: Internal Medicine

## 2015-05-15 ENCOUNTER — Ambulatory Visit
Admission: RE | Admit: 2015-05-15 | Discharge: 2015-05-15 | Disposition: A | Discharge status: Home / Self Care | Payer: BLUE CROSS/BLUE SHIELD | Source: Ambulatory Visit | Attending: Oncology | Admitting: Oncology

## 2015-05-15 DIAGNOSIS — Z1231 Encounter for screening mammogram for malignant neoplasm of breast: Secondary | ICD-10-CM

## 2015-05-24 ENCOUNTER — Ambulatory Visit: Payer: BC Managed Care – PPO | Admitting: Internal Medicine

## 2015-07-09 ENCOUNTER — Telehealth: Payer: Self-pay | Admitting: Internal Medicine

## 2015-07-10 NOTE — Telephone Encounter (Signed)
Called pt. Verified address, will mail to pt. Today or tomorrow. Nothing further needed.

## 2015-07-11 ENCOUNTER — Telehealth: Payer: Self-pay | Admitting: Internal Medicine

## 2015-07-11 NOTE — Telephone Encounter (Signed)
Allergy Serum Extract Date Mixed: 07/11/15 oops I put in the wrong date 2 Vial: 2 Strength: 1:10 Here/Mail/Pick Up: mail Mixed By: tbs  disreguard  the  1st one I was going edit it but somehow I messed that up.

## 2015-07-11 NOTE — Telephone Encounter (Signed)
Allergy Serum Extract Date Mixed: 06/10/15 Vial: 2 Strength: 1:10 Here/Mail/Pick Up: mail Mixed By: Laurette Schimke

## 2015-07-12 ENCOUNTER — Ambulatory Visit (INDEPENDENT_AMBULATORY_CARE_PROVIDER_SITE_OTHER): Payer: BLUE CROSS/BLUE SHIELD

## 2015-07-12 DIAGNOSIS — J309 Allergic rhinitis, unspecified: Secondary | ICD-10-CM | POA: Diagnosis not present

## 2015-07-16 ENCOUNTER — Encounter: Payer: Self-pay | Admitting: Internal Medicine

## 2015-10-09 ENCOUNTER — Ambulatory Visit (INDEPENDENT_AMBULATORY_CARE_PROVIDER_SITE_OTHER): Payer: Managed Care, Other (non HMO) | Admitting: Internal Medicine

## 2015-10-09 ENCOUNTER — Encounter: Payer: Self-pay | Admitting: Internal Medicine

## 2015-10-09 VITALS — BP 110/66 | HR 106 | Ht 64.0 in | Wt 159.4 lb

## 2015-10-09 DIAGNOSIS — J45998 Other asthma: Secondary | ICD-10-CM | POA: Diagnosis not present

## 2015-10-09 DIAGNOSIS — J309 Allergic rhinitis, unspecified: Secondary | ICD-10-CM | POA: Diagnosis not present

## 2015-10-09 DIAGNOSIS — J3089 Other allergic rhinitis: Principal | ICD-10-CM

## 2015-10-09 DIAGNOSIS — J302 Other seasonal allergic rhinitis: Secondary | ICD-10-CM

## 2015-10-09 NOTE — Patient Instructions (Addendum)
We can continue allergy vaccine 1:10 for another year. Suggest you try every other week to explore how this feels.

## 2015-10-09 NOTE — Progress Notes (Signed)
Patient ID: [name], female   DOB: 05-01-1961, 55 y.o.   MRN: [id number]  Asthma Her past medical history is significant for asthma.   01/21/11- 50 yoF never smoker followed for allergic rhinitis, asthma, complicated by hx breast cancer/ mastectomy.  Last here Jun 27, 2010 Had done well through winter and spring till she noted onset of nasal drainage and some chest tightness while out shopping. No fever, sore throat or discolored sputum. Went to UC 4 days ago. Told to take allegra. Now chest feels well. Head still draining. A little cough, no wheeze. Light headed. Onset with some headache, not now. Has not had problem with her allergy shots still at 1:10.   05/16/11- 50 yoF never smoker followed for allergic rhinitis, asthma, complicated by hx breast cancer/ mastectomy.  Acute visit-"major attack" second this year- nasal congestion, clear mucus. Cough at night. Denies fever, sore throat. Using Allegra-D . Does do saline nasal rinse.   09/17/11- 59 yoF never smoker followed for allergic rhinitis, asthma, complicated by hx breast cancer/ mastectomy.  Acute visit; sinus headache and excessive drainage x 2 months-not seeing any color to the drainage now Frontal headache and maxillary sinus pressure since Thanksgiving. Took amoxicillin then with temporary help. No discharge now. Taking Mucinex D alternating with Allegra-D. Denies any discomfort in her ears or chest.  05/21/12- 51 yoF never smoker followed for allergic rhinitis, asthma, complicated by hx breast cancer/ mastectomy Doing well on vaccine 1:10 GO and doing well with it; slight flare up with allergies few weeks ago but back to normal now. Has not needed inhaler in 2 years. No recent sinus infection. Uses Allegra-D.  05/23/13- 52 yoF never smoker followed for allergic rhinitis, asthma, complicated by hx breast cancer/ mastectomy FOLLOWS FOR: Allergies improved since last OV. No complaints or concern today Allergy vaccine 1:10 GO Better  year with les need for benadryl. Likes Astelin nasal spray.  No need for rescue inhaler in 2 years- no major respiratory infections.  05/23/14- 80 yoF never smoker followed for allergic rhinitis, asthma, complicated by hx breast cancer/ mastectomy FOLLOWS FOR: Pt states she had a "bad allergy spell" in last week of August. Pt states she had drainage that progressed into a chest cold, but has now resolved. Pt states taking the flonase and vit C is helping. Pt taking allergy vaccine1:10 GO and she is doing well with it.   02/07/15- 23 yoF never smoker followed for allergic rhinitis, asthma, complicated by hx breast cancer/ mastectomy Reports: pt. is having allergy headaches, OTC mucinex D, headache benyadryl, antibiotics not helping Continues allergy vaccine 1:10 She called asking for work-in and hoping for a steroid shot to see if it would help frontal headache she attributes to sinus pain. Blows or saline lavages only clear mucus now. This episode began 7-10 days ago with sneeze and itch- used Flonase . Worse if leans over. Augmentin x 7 days helped clear mucus. Avoids Mucinex-D which makes head pain worse.   10/09/2015-55 year old female never smoker followed for allergic rhinitis, asthma, complicated by history breast cancer/mastectomy L Allergy vaccine 1:10 G0 FOLLOWS FOR: Pt continues her allergy vaccine injections weekly;denies any reactions or flare ups at this time. Headaches experienced had last visit were finally attributed to tension-related neck pain resolved by improvements at work. No asthma and no need for inhaler in the last 3 years.  Review of Systems- see HPI Constitutional:   No weight loss, night sweats,  , chills, fatigue, lassitude. HEENT:   +  headaches,  No- Difficulty swallowing,  Tooth/dental problems,  Sore throat,  CV:  No chest pain,  Orthopnea, PND, swelling in lower extremities, anasarca, dizziness, palpitations GI  No heartburn, indigestion, abdominal pain, nausea,  vomiting,  Resp: No shortness of breath with exertion or at rest.  No excess mucus, no productive cough,            no- non-productive cough,  No coughing up of blood.  No change in color of mucus.  Skin: no rash or lesions. GU: . MS:  No joint pain or swelling.   Psych:  No change in mood or affect. No depression or anxiety.  No memory loss.   Objective:   Physical Exam General- Alert, Oriented, Affect-appropriate, Distress- none acute Skin- rash-none, lesions- none, excoriation- none Lymphadenopathy- none Head- atraumatic            Eyes- Gross vision intact, PERRLA, conjunctivae clear secretions            Ears- Hearing, canals-normal            Nose- turbinate edema mild+thick white mucus,  no-Septal dev, mucus, polyps, erosion, perforation             Throat- Mallampati II , mucosa clear , drainage- none, tonsils- atrophic Neck- flexible , trachea midline, no stridor , thyroid nl, carotid no bruit Chest - symmetrical excursion , unlabored           Heart/CV- RRR , no murmur , no gallop  , no rub, nl s1 s2                           - JVD- none , edema- none, stasis changes- none, varices- none           Lung- clear to P&A, wheeze- none, cough- none , dullness-none, rub- none           Chest wall- + L mastectomy Abd- Br/ Gen/ Rectal- Not done, not indicated Extrem- cyanosis- none, clubbing, none, atrophy- none, strength- nl Neuro- grossly intact to observation

## 2015-10-10 NOTE — Assessment & Plan Note (Signed)
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

## 2015-10-10 NOTE — Assessment & Plan Note (Signed)
I suggested she try increasing interval to every 2 weeks between allergy shots as we explore stopping.

## 2015-10-31 ENCOUNTER — Encounter: Payer: Self-pay | Admitting: Internal Medicine

## 2016-03-21 ENCOUNTER — Telehealth: Payer: Self-pay | Admitting: Internal Medicine

## 2016-03-21 DIAGNOSIS — J309 Allergic rhinitis, unspecified: Secondary | ICD-10-CM

## 2016-03-21 NOTE — Telephone Encounter (Signed)
Allergy Serum Extract Date Mixed: 03/21/16 Vial: AB Strength: 1:10 Here/Mail/Pick Up: Mail Mixed By: Desmond Dike, CMA Last OV: 10/09/15 Pending OV: 07/14/16

## 2016-03-24 ENCOUNTER — Other Ambulatory Visit: Payer: Self-pay | Admitting: Oncology

## 2016-03-31 ENCOUNTER — Telehealth: Payer: Self-pay | Admitting: Oncology

## 2016-03-31 ENCOUNTER — Other Ambulatory Visit: Payer: Self-pay | Admitting: Oncology

## 2016-03-31 DIAGNOSIS — Z1231 Encounter for screening mammogram for malignant neoplasm of breast: Secondary | ICD-10-CM

## 2016-03-31 DIAGNOSIS — R922 Inconclusive mammogram: Secondary | ICD-10-CM

## 2016-03-31 DIAGNOSIS — Z1239 Encounter for other screening for malignant neoplasm of breast: Secondary | ICD-10-CM

## 2016-03-31 NOTE — Telephone Encounter (Signed)
lvm to inform pt of mammogram appt and r/s ll appt 9/11 at 130 pm

## 2016-04-07 ENCOUNTER — Ambulatory Visit: Payer: Managed Care, Other (non HMO) | Admitting: Internal Medicine

## 2016-04-14 ENCOUNTER — Other Ambulatory Visit: Payer: Managed Care, Other (non HMO)

## 2016-04-14 ENCOUNTER — Ambulatory Visit: Payer: Managed Care, Other (non HMO) | Admitting: Oncology

## 2016-05-09 HISTORY — PX: BREAST BIOPSY: SHX20

## 2016-05-15 ENCOUNTER — Ambulatory Visit
Admission: RE | Admit: 2016-05-15 | Discharge: 2016-05-15 | Disposition: A | Discharge status: Home / Self Care | Payer: Managed Care, Other (non HMO) | Source: Ambulatory Visit | Attending: Oncology | Admitting: Oncology

## 2016-05-15 DIAGNOSIS — Z1231 Encounter for screening mammogram for malignant neoplasm of breast: Secondary | ICD-10-CM

## 2016-05-16 ENCOUNTER — Telehealth: Payer: Self-pay

## 2016-05-16 ENCOUNTER — Other Ambulatory Visit: Payer: Self-pay

## 2016-05-16 DIAGNOSIS — C50912 Malignant neoplasm of unspecified site of left female breast: Secondary | ICD-10-CM

## 2016-05-19 ENCOUNTER — Other Ambulatory Visit: Payer: Managed Care, Other (non HMO)

## 2016-05-19 ENCOUNTER — Other Ambulatory Visit: Payer: Self-pay | Admitting: Oncology

## 2016-05-19 ENCOUNTER — Ambulatory Visit: Payer: Managed Care, Other (non HMO) | Admitting: Oncology

## 2016-05-19 DIAGNOSIS — R922 Inconclusive mammogram: Secondary | ICD-10-CM

## 2016-05-19 DIAGNOSIS — Z1239 Encounter for other screening for malignant neoplasm of breast: Secondary | ICD-10-CM

## 2016-05-19 DIAGNOSIS — R928 Other abnormal and inconclusive findings on diagnostic imaging of breast: Secondary | ICD-10-CM

## 2016-05-26 ENCOUNTER — Ambulatory Visit
Admission: RE | Admit: 2016-05-26 | Discharge: 2016-05-26 | Disposition: A | Discharge status: Home / Self Care | Payer: Managed Care, Other (non HMO) | Source: Ambulatory Visit | Attending: Oncology | Admitting: Oncology

## 2016-05-26 ENCOUNTER — Other Ambulatory Visit: Payer: Self-pay | Admitting: Oncology

## 2016-05-26 DIAGNOSIS — R922 Inconclusive mammogram: Secondary | ICD-10-CM

## 2016-05-26 DIAGNOSIS — N6310 Unspecified lump in the right breast, unspecified quadrant: Secondary | ICD-10-CM

## 2016-05-26 DIAGNOSIS — Z1239 Encounter for other screening for malignant neoplasm of breast: Secondary | ICD-10-CM

## 2016-05-26 DIAGNOSIS — R928 Other abnormal and inconclusive findings on diagnostic imaging of breast: Secondary | ICD-10-CM

## 2016-05-28 ENCOUNTER — Other Ambulatory Visit: Payer: Self-pay | Admitting: Oncology

## 2016-05-28 ENCOUNTER — Ambulatory Visit
Admission: RE | Admit: 2016-05-28 | Discharge: 2016-05-28 | Disposition: A | Discharge status: Home / Self Care | Payer: Managed Care, Other (non HMO) | Source: Ambulatory Visit | Attending: Oncology | Admitting: Oncology

## 2016-05-28 DIAGNOSIS — Z1239 Encounter for other screening for malignant neoplasm of breast: Secondary | ICD-10-CM

## 2016-05-28 DIAGNOSIS — R922 Inconclusive mammogram: Secondary | ICD-10-CM

## 2016-05-28 DIAGNOSIS — N6310 Unspecified lump in the right breast, unspecified quadrant: Secondary | ICD-10-CM

## 2016-05-30 ENCOUNTER — Inpatient Hospital Stay: Admission: RE | Admit: 2016-05-30 | Payer: Managed Care, Other (non HMO) | Source: Ambulatory Visit

## 2016-06-02 ENCOUNTER — Ambulatory Visit (HOSPITAL_BASED_OUTPATIENT_CLINIC_OR_DEPARTMENT_OTHER): Payer: Managed Care, Other (non HMO) | Admitting: Oncology

## 2016-06-02 ENCOUNTER — Other Ambulatory Visit (HOSPITAL_BASED_OUTPATIENT_CLINIC_OR_DEPARTMENT_OTHER): Payer: Managed Care, Other (non HMO)

## 2016-06-02 VITALS — BP 127/82 | HR 79 | Temp 98.2°F | Resp 18 | Ht 64.0 in | Wt 156.6 lb

## 2016-06-02 DIAGNOSIS — C50912 Malignant neoplasm of unspecified site of left female breast: Secondary | ICD-10-CM

## 2016-06-02 DIAGNOSIS — D241 Benign neoplasm of right breast: Secondary | ICD-10-CM

## 2016-06-02 DIAGNOSIS — Z853 Personal history of malignant neoplasm of breast: Secondary | ICD-10-CM

## 2016-06-02 LAB — CBC WITH DIFFERENTIAL/PLATELET
BASO%: 0.7 % (ref 0.0–2.0)
BASOS ABS: 0 10*3/uL (ref 0.0–0.1)
EOS ABS: 0.2 10*3/uL (ref 0.0–0.5)
EOS%: 3.9 % (ref 0.0–7.0)
HEMATOCRIT: 39.6 % (ref 34.8–46.6)
HEMOGLOBIN: 13.2 g/dL (ref 11.6–15.9)
LYMPH#: 1.3 10*3/uL (ref 0.9–3.3)
LYMPH%: 29.4 % (ref 14.0–49.7)
MCH: 31.3 pg (ref 25.1–34.0)
MCHC: 33.4 g/dL (ref 31.5–36.0)
MCV: 93.8 fL (ref 79.5–101.0)
MONO#: 0.4 10*3/uL (ref 0.1–0.9)
MONO%: 9.1 % (ref 0.0–14.0)
NEUT#: 2.6 10*3/uL (ref 1.5–6.5)
NEUT%: 56.9 % (ref 38.4–76.8)
PLATELETS: 216 10*3/uL (ref 145–400)
RBC: 4.23 10*6/uL (ref 3.70–5.45)
RDW: 12.9 % (ref 11.2–14.5)
WBC: 4.6 10*3/uL (ref 3.9–10.3)

## 2016-06-02 LAB — COMPREHENSIVE METABOLIC PANEL
ALBUMIN: 3.7 g/dL (ref 3.5–5.0)
ALK PHOS: 83 U/L (ref 40–150)
ALT: 22 U/L (ref 0–55)
ANION GAP: 8 meq/L (ref 3–11)
AST: 18 U/L (ref 5–34)
BUN: 12 mg/dL (ref 7.0–26.0)
CALCIUM: 9.5 mg/dL (ref 8.4–10.4)
CO2: 27 mEq/L (ref 22–29)
CREATININE: 1.1 mg/dL (ref 0.6–1.1)
Chloride: 108 mEq/L (ref 98–109)
EGFR: 59 mL/min/{1.73_m2} — ABNORMAL LOW (ref >90)
Glucose: 96 mg/dl (ref 70–140)
POTASSIUM: 4 meq/L (ref 3.5–5.1)
Sodium: 143 mEq/L (ref 136–145)
Total Bilirubin: <0.3 mg/dL (ref 0.20–1.20)
Total Protein: 6.8 g/dL (ref 6.4–8.3)

## 2016-06-02 NOTE — Progress Notes (Signed)
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 She has had mostly yearly follow up with medical oncology due to the previous chemotherapy. She has been on observation since completing 5 years of tamoxifen March 2004. Most recent right mammogram was at [facility] 05-2016, showing asymmetry in the right breast. Initial imaging as well as a biopsy were performed. 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 Repeat right diagnostic mammography is scheduled for 6 months.  Patient has been doing well overall for the past 2 years, with no complaints that seem referable to breast cancer history. She denies new or different pain, any bleeding, GI or other respiratory concerns. She is not aware of any changes in right breast or left breast reconstruction.she continues to have issues with her migraine headaches and is followed by a specialist. Energy and appetite are good. She continues to work and is active at home.  She does not have central catheter. I do not believe that she has had genetics testing. She has not had hysterectomy or oophorectomy. Bone density scan was ordered 2013, but not done at least in [facility] system   ONCOLOGIC HISTORY T1N1 (one of nine axillary nodes +) diagnosed 1998 at age 37, treated with mastectomy with axillary node evaluation, TRAM reconstruction, adjuvant adriamycin, cytoxan and taxol chemotherapy then 5 years of tamoxifen thru 11-2002.   Review of systems as above, also: No fever or symptoms of infection. No LE swelling.  Remainder of 10 point Review of Systems negative.  Objective:  Vital signs in last 24 hours:  BP  127/82 (BP Location: Left Arm, Patient Position: Sitting)   Pulse 79   Temp 98.2 F (36.8 C) (Oral)   Resp 18   Ht 5' 4"  (1.626 m)   Wt 156 lb 9.6 oz (71 kg)   SpO2 100%   BMI 26.88 kg/m   Alert, oriented and appropriate, looks comfortable. Ambulatory without difficulty.    HEENT:PERRL, sclerae not icteric. Oral mucosa moist without lesions, posterior pharynx clear.  Neck supple. No JVD.  Lymphatics:no cervical,supraclavicular, axillary or inguinal adenopathy Resp: clear to auscultation bilaterally and normal percussion bilaterally Cardio: regular rate and rhythm. No gallop. GI: soft, nontender, not distended, no mass or organomegaly. Normally active bowel sounds. Surgical incision not remarkable. Musculoskeletal/ Extremities: without pitting edema, cords, tenderness UE or LE Neuro: nonfocal PSYCH appropriate mood and affect Skin without rash, ecchymosis, petechiae Breasts: Right without dominant mass, skin or nipple findings. Left reconstructed breast without findings of concern for local recurrence. Axillae benign.   Lab Results:  Results for orders placed or performed in visit on 06/02/16  CBC with Differential  Result Value Ref Range   WBC 4.6 3.9 - 10.3 10e3/uL   NEUT# 2.6 1.5 - 6.5 10e3/uL   HGB 13.2 11.6 - 15.9 g/dL   HCT 39.6 34.8 - 46.6 %   Platelets 216 145 - 400 10e3/uL   MCV 93.8 79.5 - 101.0 fL   MCH 31.3 25.1 - 34.0 pg   MCHC 33.4 31.5 - 36.0 g/dL   RBC 4.23 3.70 - 5.45 10e6/uL   RDW 12.9 11.2 - 14.5 %   lymph# 1.3 0.9 - 3.3 10e3/uL  MONO# 0.4 0.1 - 0.9 10e3/uL   Eosinophils Absolute 0.2 0.0 - 0.5 10e3/uL   Basophils Absolute 0.0 0.0 - 0.1 10e3/uL   NEUT% 56.9 38.4 - 76.8 %   LYMPH% 29.4 14.0 - 49.7 %   MONO% 9.1 0.0 - 14.0 %   EOS% 3.9 0.0 - 7.0 %   BASO% 0.7 0.0 - 2.0 %  Comprehensive metabolic panel  Result Value Ref Range   Sodium 143 136 - 145 mEq/L   Potassium 4.0 3.5 - 5.1 mEq/L   Chloride 108 98 - 109 mEq/L   CO2 27 22 - 29 mEq/L    Glucose 96 70 - 140 mg/dl   BUN 12.0 7.0 - 26.0 mg/dL   Creatinine 1.1 0.6 - 1.1 mg/dL   Total Bilirubin <0.30 0.20 - 1.20 mg/dL   Alkaline Phosphatase 83 40 - 150 U/L   AST 18 5 - 34 U/L   ALT 22 0 - 55 U/L   Total Protein 6.8 6.4 - 8.3 g/dL   Albumin 3.7 3.5 - 5.0 g/dL   Calcium 9.5 8.4 - 10.4 mg/dL   Anion Gap 8 3 - 11 mEq/L   EGFR 59 (L) >90 ml/min/1.73 m2    WBC/ANC was higher 03-2013 with acute appendicitis, otherwise in this range previously  CMET available after visit entirely normal  Studies/Results: EXAM: DIGITAL SCREENING UNILATERAL RIGHT MAMMOGRAM WITH CAD  COMPARISON:  Previous exam(s).  ACR Breast Density Category c: The breast tissue is heterogeneously dense, which may obscure small masses.  FINDINGS: In the right breast, a possible asymmetry warrants further evaluation. In the left breast, no findings suspicious for malignancy. Images were processed with CAD.  IMPRESSION: Further evaluation is suggested for possible asymmetry in the right breast.  RECOMMENDATION: Diagnostic mammogram and possibly ultrasound of the right breast. (Code:FI-R-10M)  The patient will be contacted regarding the findings, and additional imaging will be scheduled.  BI-RADS CATEGORY  0: Incomplete. Need additional imaging evaluation and/or prior mammograms for comparison.   Electronically Signed   By: Lovey Newcomer M.D.   On: 05/16/2016 08:13  Medications: I have reviewed the patient's current medications.  DISCUSSION:  Breast exam was benign today. Labs were normal except for a slightly decreased GFR. A copy of the labs are faxed to her primary care provider. The patient prefers to follow-up in our office on as-needed basis. Recommend that she have an annual 3-D mammogram and clinical breast exam. We will be happy to see her back in this office at any time if she changes her mind.  Assessment/Plan: 1.T1N1 left breast carcinoma diagnosed 1998 at age 62 and  premenopausal: that pathology and other information did not transfer into this EMR, predated first medical oncology EMR and likely is in microfilm. I will ask HIM to locate chemo records and path information so that it can be available in this EMR in case needed in future. Follow-up as needed. Mammograms yearly as above 2. Appendectomy 03-2013 for acute appendicitis 3.allergic respiratory symptoms 4.flu vaccine done September 2016      Mikey Bussing, NP   06/02/2016, 4:05 PM

## 2016-07-07 ENCOUNTER — Telehealth: Payer: Self-pay | Admitting: Internal Medicine

## 2016-07-07 NOTE — Telephone Encounter (Signed)
Spoke with pt who states she is needing to reschedule her appointment for 07-14-16 as she will be out of town for two weeks. I have explained to pt that the earliest that we could get her in is the first of the next, however the schedule is not out that far so a recall will have to be placed. Pt states she's okay with waiting as she is not having any problems currently.  CY please advise if you are ok with me putting ina recall for the first of the year or would you like to see pt sooner. Thanks.

## 2016-07-08 NOTE — Telephone Encounter (Signed)
Recall placed per CY. LM to make pt aware of this. Nothing further needed.

## 2016-07-08 NOTE — Telephone Encounter (Signed)
Ok to reschedule for early 2018 with recall

## 2016-07-14 ENCOUNTER — Ambulatory Visit: Payer: Managed Care, Other (non HMO) | Admitting: Internal Medicine

## 2016-07-16 ENCOUNTER — Telehealth: Payer: Self-pay | Admitting: Internal Medicine

## 2016-07-16 NOTE — Telephone Encounter (Signed)
atc pt, unnamed woman answered phone but kept saying "hello?" as if she could not hear me.   atc back X2, line rang to fast busy signal.  Wcb.

## 2016-07-18 NOTE — Telephone Encounter (Signed)
lmtcb for pt.  

## 2016-07-21 MED ORDER — ALBUTEROL SULFATE HFA 108 (90 BASE) MCG/ACT IN AERS: 2.0000 | INHALATION_SPRAY | Freq: Four times a day (QID) | RESPIRATORY_TRACT | 2 refills | Status: AC | PRN

## 2016-07-21 NOTE — Telephone Encounter (Signed)
Pt scheduled for OV with CY on 07/28/16 at 3:30 Proventil was sent to [facility] [address] this morning by another nurse. Nothing further needed.

## 2016-07-21 NOTE — Telephone Encounter (Signed)
Called and spoke with pt about her rescue inhaler and she stated that she will need a refill of this.    Pt also stated that she had to cancel her yearly appt with CY and needs to reschedule.  She is in [place] at this time and will not be able to come in until next week.  CY is booked up.  KW please advise of a time to offer. thanks

## 2016-07-21 NOTE — Telephone Encounter (Signed)
Next opening is fine.

## 2016-07-28 ENCOUNTER — Encounter: Payer: Self-pay | Admitting: Internal Medicine

## 2016-07-28 ENCOUNTER — Ambulatory Visit (INDEPENDENT_AMBULATORY_CARE_PROVIDER_SITE_OTHER): Payer: Managed Care, Other (non HMO) | Admitting: Internal Medicine

## 2016-07-28 DIAGNOSIS — J302 Other seasonal allergic rhinitis: Secondary | ICD-10-CM | POA: Diagnosis not present

## 2016-07-28 DIAGNOSIS — J45998 Other asthma: Secondary | ICD-10-CM | POA: Diagnosis not present

## 2016-07-28 DIAGNOSIS — J3089 Other allergic rhinitis: Secondary | ICD-10-CM

## 2016-07-28 NOTE — Progress Notes (Signed)
Patient ID: [name], female   DOB: 06-30-1961, 55 y.o.   MRN: [id number]  Asthma F never smoker followed for allergic rhinitis, asthma, complicated by hx breast cancer/ mastectomy.    02/07/15- 58 yoF never smoker followed for allergic rhinitis, asthma, complicated by hx breast cancer/ mastectomy Reports: pt. is having allergy headaches, OTC mucinex D, headache benyadryl, antibiotics not helping Continues allergy vaccine 1:10 She called asking for work-in and hoping for a steroid shot to see if it would help frontal headache she attributes to sinus pain. Blows or saline lavages only clear mucus now. This episode began 7-10 days ago with sneeze and itch- used Flonase . Worse if leans over. Augmentin x 7 days helped clear mucus. Avoids Mucinex-D which makes head pain worse.   10/09/2015-55 year old female never smoker followed for allergic rhinitis, asthma, complicated by history breast cancer/mastectomy L Allergy vaccine 1:10 G0 FOLLOWS FOR: Pt continues her allergy vaccine injections weekly;denies any reactions or flare ups at this time. Headaches experienced had last visit were finally attributed to tension-related neck pain resolved by improvements at work. No asthma and no need for inhaler in the last 3 years.  07/28/2016-55 year old female never smoker followed for Allergic rhinitis, Asthma, complicated by history of breast cancer/mastectomy left Allergy vaccine 1:10 GO FOLLOWS FOR still on vaccine,no reactions. She has been doing very well with little impact by late fall weather. Very occasional use of rescue inhaler and no sleep disturbance.  Review of Systems- see HPI Constitutional:   No weight loss, night sweats,  , chills, fatigue, lassitude. HEENT:   + headaches,  No- Difficulty swallowing,  Tooth/dental problems,  Sore throat,  CV:  No chest pain,  Orthopnea, PND, swelling in lower extremities, anasarca, dizziness, palpitations GI  No heartburn, indigestion, abdominal  pain, nausea, vomiting,  Resp: No shortness of breath with exertion or at rest.  No excess mucus, no productive cough,            no- non-productive cough,  No coughing up of blood.  No change in color of mucus.  Skin: no rash or lesions. GU: . MS:  No joint pain or swelling.   Psych:  No change in mood or affect. No depression or anxiety.  No memory loss.   Objective:   Physical Exam General- Alert, Oriented, Affect-appropriate, Distress- none acute Skin- rash-none, lesions- none, excoriation- none Lymphadenopathy- none Head- atraumatic            Eyes- Gross vision intact, PERRLA, conjunctivae clear secretions            Ears- Hearing, canals-normal            Nose- turbinate edema mild+thick white mucus,  no-Septal dev, mucus, polyps, erosion, perforation             Throat- Mallampati II , mucosa clear , drainage- none, tonsils- atrophic Neck- flexible , trachea midline, no stridor , thyroid nl, carotid no bruit Chest - symmetrical excursion , unlabored           Heart/CV- RRR , no murmur , no gallop  , no rub, nl s1 s2                           - JVD- none , edema- none, stasis changes- none, varices- none           Lung- clear to P&A, wheeze- none, cough- none , dullness-none, rub- none  Chest wall- + L mastectomy Abd- Br/ Gen/ Rectal- Not done, not indicated Extrem- cyanosis- none, clubbing, none, atrophy- none, strength- nl Neuro- grossly intact to observation

## 2016-07-28 NOTE — Patient Instructions (Signed)
As discussed- We will be ending our allergy vaccine program here at the end of December. Most patients can use otc antihistamines and Flonase/ fluticasone nasal spray to control mild symptoms, while waiting to see how they do through the seasons. If you need to, you can switch to another allergy office at any time.  Please call if we can help

## 2016-08-05 NOTE — Assessment & Plan Note (Signed)
She remains well controlled. We will keep a rescue inhaler available but she has not needed that maintenance controller.

## 2016-08-05 NOTE — Assessment & Plan Note (Signed)
She has reached maximum expected benefit from current allergy vaccine. As we plan to close this program, she will use up existing vaccine supply and then stop for observation. If necessary she can establish with another allergy practice in town.

## 2016-10-03 ENCOUNTER — Emergency Department (HOSPITAL_COMMUNITY)
Admission: EM | Admit: 2016-10-03 | Discharge: 2016-10-04 | Disposition: A | Discharge status: Home / Self Care | Payer: Managed Care, Other (non HMO) | Attending: Emergency Medicine | Admitting: Emergency Medicine

## 2016-10-03 ENCOUNTER — Encounter (HOSPITAL_COMMUNITY): Payer: Self-pay | Admitting: *Deleted

## 2016-10-03 ENCOUNTER — Emergency Department (HOSPITAL_COMMUNITY): Payer: Managed Care, Other (non HMO)

## 2016-10-03 DIAGNOSIS — N390 Urinary tract infection, site not specified: Secondary | ICD-10-CM | POA: Diagnosis not present

## 2016-10-03 DIAGNOSIS — Z853 Personal history of malignant neoplasm of breast: Secondary | ICD-10-CM | POA: Insufficient documentation

## 2016-10-03 DIAGNOSIS — R509 Fever, unspecified: Secondary | ICD-10-CM | POA: Diagnosis present

## 2016-10-03 LAB — COMPREHENSIVE METABOLIC PANEL
ALBUMIN: 3.8 g/dL (ref 3.5–5.0)
ALK PHOS: 90 U/L (ref 38–126)
ALT: 25 U/L (ref 14–54)
AST: 23 U/L (ref 15–41)
Anion gap: 11 (ref 5–15)
BUN: 21 mg/dL — ABNORMAL HIGH (ref 6–20)
CALCIUM: 9.2 mg/dL (ref 8.9–10.3)
CO2: 25 mmol/L (ref 22–32)
CREATININE: 1.8 mg/dL — AB (ref 0.44–1.00)
Chloride: 99 mmol/L — ABNORMAL LOW (ref 101–111)
GFR calc Af Amer: 35 mL/min — ABNORMAL LOW (ref >60)
GFR calc non Af Amer: 30 mL/min — ABNORMAL LOW (ref >60)
GLUCOSE: 99 mg/dL (ref 65–99)
Potassium: 4 mmol/L (ref 3.5–5.1)
SODIUM: 135 mmol/L (ref 135–145)
Total Bilirubin: 0.8 mg/dL (ref 0.3–1.2)
Total Protein: 7.5 g/dL (ref 6.5–8.1)

## 2016-10-03 LAB — URINALYSIS, ROUTINE W REFLEX MICROSCOPIC
Bilirubin Urine: NEGATIVE
GLUCOSE, UA: NEGATIVE mg/dL
Ketones, ur: 20 mg/dL — AB
NITRITE: NEGATIVE
PH: 5 (ref 5.0–8.0)
PROTEIN: 100 mg/dL — AB
Specific Gravity, Urine: 1.014 (ref 1.005–1.030)

## 2016-10-03 LAB — CBC WITH DIFFERENTIAL/PLATELET
BASOS PCT: 0 %
Basophils Absolute: 0 10*3/uL (ref 0.0–0.1)
EOS ABS: 0 10*3/uL (ref 0.0–0.7)
EOS PCT: 0 %
HCT: 36.8 % (ref 36.0–46.0)
Hemoglobin: 13 g/dL (ref 12.0–15.0)
Lymphocytes Relative: 8 %
Lymphs Abs: 0.7 10*3/uL (ref 0.7–4.0)
MCH: 32 pg (ref 26.0–34.0)
MCHC: 35.3 g/dL (ref 30.0–36.0)
MCV: 90.6 fL (ref 78.0–100.0)
MONO ABS: 0.9 10*3/uL (ref 0.1–1.0)
MONOS PCT: 9 %
Neutro Abs: 7.9 10*3/uL — ABNORMAL HIGH (ref 1.7–7.7)
Neutrophils Relative %: 83 %
Platelets: 172 10*3/uL (ref 150–400)
RBC: 4.06 MIL/uL (ref 3.87–5.11)
RDW: 12.5 % (ref 11.5–15.5)
WBC: 9.5 10*3/uL (ref 4.0–10.5)

## 2016-10-03 LAB — I-STAT CG4 LACTIC ACID, ED
Lactic Acid, Venous: 0.79 mmol/L (ref 0.5–1.9)
Lactic Acid, Venous: 1.25 mmol/L (ref 0.5–1.9)

## 2016-10-03 MED ORDER — SODIUM CHLORIDE 0.9 % IV BOLUS (SEPSIS)
2000.0000 mL | Freq: Once | INTRAVENOUS | Status: AC
  Administered 2016-10-03: 2000 mL via INTRAVENOUS

## 2016-10-03 MED ORDER — ACETAMINOPHEN 325 MG PO TABS
650.0000 mg | ORAL_TABLET | Freq: Once | ORAL | Status: AC
  Administered 2016-10-03: 650 mg via ORAL
  Filled 2016-10-03: qty 2

## 2016-10-03 MED ORDER — DEXTROSE 5 % IV SOLN
1.0000 g | INTRAVENOUS | Status: DC
  Administered 2016-10-03: 1 g via INTRAVENOUS
  Filled 2016-10-03: qty 10

## 2016-10-03 MED ORDER — SODIUM CHLORIDE 0.9 % IV SOLN: INTRAVENOUS | Status: DC

## 2016-10-03 NOTE — ED Triage Notes (Signed)
Pt reports she was sent here by her doctor to r/o sepsis.  Pt have been sick for the last 4 days with fever and non-productive cough.  Pt went to see her doctor today d/t fever and dark urine with foul odor x 2 week.

## 2016-10-03 NOTE — ED Provider Notes (Signed)
[facility] DEPT Provider Note   CSN: [id number] Arrival date & time: 10/03/16  1510     History   Chief Complaint Chief Complaint  Patient presents with  . Fever    HPI Aleah Long Teran is a 56 y.o. female.  56 year old female presents with several day history of cough and congestion. She is also had some dysuria as well as foul smelling urine. Cough is been nonproductive and not associated with fever. Denies any diarrhea. No emesis noted. Denies abdominal flank pain. Saw her doctor today where she was febrile to 100.3 and tachycardic to 123. She also had cloudy urine containing blood and white blood cells. That result was reviewed and was also leukocyte and nitrite positive. She does have a history of prior UTIs in the past. She denies any confusion and mental status changes. No treatment use prior to arrival Was sent here for possible sepsis.      Past Medical History:  Diagnosis Date  . Allergic rhinitis, cause unspecified   . Breast cancer ([place])    chemo; left  . Migraine   . Unspecified asthma(493.90)     Patient Active Problem List   Diagnosis Date Noted  . Cancer of left breast, stage 2 ([place]) 05/24/2014  . Acute maxillary sinusitis 09/20/2011  . Seasonal and perennial allergic rhinitis 10/25/2007  . 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 MASTECTOMY, LEFT, HX OF 10/25/2007    Past Surgical History:  Procedure Laterality Date  . LAPAROSCOPIC APPENDECTOMY N/A 03/27/2013   Procedure: APPENDECTOMY LAPAROSCOPIC;  Surgeon: Leighton Ruff, MD;  Location: [facility];  Service: General;  Laterality: N/A;  . MASTECTOMY     left; chemo  . SINUS SURGERY WITH INSTATRAK      OB History    No data available       Home Medications    Prior to Admission medications   Medication Sig Start Date End Date Taking? Authorizing Provider  albuterol (PROVENTIL HFA;VENTOLIN HFA) 108 (90 Base) MCG/ACT inhaler Inhale 2 puffs into the lungs every 6 (six) hours as needed for  wheezing or shortness of breath. 07/21/16  Yes Deneise Lever, MD  ALPRAZolam Duanne Moron) 0.5 MG tablet Take 0.5 mg by mouth daily as needed for anxiety.    Yes Historical Provider, MD  amphetamine-dextroamphetamine (ADDERALL XR) 30 MG 24 hr capsule Take 30 mg by mouth See admin instructions. Takes only Monday through Friday   Yes Historical Provider, MD  buPROPion (WELLBUTRIN XL) 300 MG 24 hr tablet Take 300 mg by mouth daily.   Yes Historical Provider, MD  cholecalciferol (VITAMIN D) 400 units TABS tablet Take 800 Units by mouth daily.   Yes Historical Provider, MD  cyclobenzaprine (FLEXERIL) 10 MG tablet Take 1 tablet by mouth at bedtime.  10/01/15  Yes Historical Provider, MD  MAGNESIUM CITRATE PO Take 1 tablet by mouth daily.   Yes Historical Provider, MD  NONFORMULARY OR COMPOUNDED ITEM Inject 2 each into the muscle once a week. Allergy Vaccine 2 injections 1 time a week Given at Home   Yes Historical Provider, MD  rizatriptan (MAXALT) 10 MG tablet Take 10 mg by mouth every 2 (two) hours as needed for migraine.  12/12/15  Yes Historical Provider, MD    Family History Family History  Problem Relation Age of Onset  . Other Father     Lambert-Eaton neuromuscular degenerative disease    Social History Social History  Substance Use Topics  . Smoking status: Never Smoker  . Smokeless tobacco: Not on file  .  Alcohol use No     Allergies   Patient has no known allergies.   Review of Systems Review of Systems  All other systems reviewed and are negative.    Physical Exam Updated Vital Signs BP 107/79 (BP Location: Left Arm)   Pulse 120   Temp 98.2 F (36.8 C) (Oral)   Resp 16   Ht 5\' 4"  (1.626 m)   Wt 68.9 kg   SpO2 100%   BMI 26.09 kg/m   Physical Exam  Constitutional: She is oriented to person, place, and time. She appears well-developed and well-nourished.  Non-toxic appearance. No distress.  HENT:  Head: Normocephalic and atraumatic.  Eyes: Conjunctivae, EOM and lids  are normal. Pupils are equal, round, and reactive to light.  Neck: Normal range of motion. Neck supple. No tracheal deviation present. No thyroid mass present.  Cardiovascular: Regular rhythm and normal heart sounds.  Tachycardia present.  Exam reveals no gallop.   No murmur heard. Pulmonary/Chest: Effort normal and breath sounds normal. No stridor. No respiratory distress. She has no decreased breath sounds. She has no wheezes. She has no rhonchi. She has no rales.  Abdominal: Soft. Normal appearance and bowel sounds are normal. She exhibits no distension. There is no tenderness. There is no rebound and no CVA tenderness.  Musculoskeletal: Normal range of motion. She exhibits no edema or tenderness.  Neurological: She is alert and oriented to person, place, and time. She has normal strength. No cranial nerve deficit or sensory deficit. GCS eye subscore is 4. GCS verbal subscore is 5. GCS motor subscore is 6.  Skin: Skin is warm and dry. No abrasion and no rash noted.  Psychiatric: She has a normal mood and affect. Her speech is normal and behavior is normal.  Nursing note and vitals reviewed.    ED Treatments / Results  Labs (all labs ordered are listed, but only abnormal results are displayed) Labs Reviewed  COMPREHENSIVE METABOLIC PANEL - Abnormal; Notable for the following:       Result Value   Chloride 99 (*)    BUN 21 (*)    Creatinine, Ser 1.80 (*)    GFR calc non Af Amer 30 (*)    GFR calc Af Amer 35 (*)    All other components within normal limits  CBC WITH DIFFERENTIAL/PLATELET - Abnormal; Notable for the following:    Neutro Abs 7.9 (*)    All other components within normal limits  URINALYSIS, ROUTINE W REFLEX MICROSCOPIC  I-STAT CG4 LACTIC ACID, ED    EKG  EKG Interpretation None       Radiology Dg Chest 2 View  Result Date: 10/03/2016 CLINICAL DATA:  Sporadic coughing. EXAM: CHEST  2 VIEW COMPARISON:  None. FINDINGS: Cardiomediastinal silhouette is normal.  Mediastinal contours appear intact. There is no evidence of focal airspace consolidation, pleural effusion or pneumothorax. Osseous structures are without acute abnormality. Postsurgical changes in the chest wall noted. IMPRESSION: No active cardiopulmonary disease. Electronically Signed   By: Fidela Salisbury M.D.   On: 10/03/2016 16:36    Procedures Procedures (including critical care time)  Medications Ordered in ED Medications - No data to display   Initial Impression / Assessment and Plan / ED Course  I have reviewed the triage vital signs and the nursing notes.  Pertinent labs & imaging results that were available during my care of the patient were reviewed by me and considered in my medical decision making (see chart for details).  Patient given IV fluids as well as Rocephin for her urinary tract infection. Has evidence of mild acute kidney injury likely from dehydration. Blood pressure improved after given IV hydration. Will place on Keflex. Return tomorrow morning for a repeat creatinine. She feels fine and was not orthostatic.  Final Clinical Impressions(s) / ED Diagnoses   Final diagnoses:  None    New Prescriptions New Prescriptions   No medications on file     Lacretia Leigh, MD 10/04/16 0010

## 2016-10-03 NOTE — ED Notes (Signed)
[facility] called, report pt has oral temp of 100.3, HR 123, and cloudy urine containing blood and WBCs. [name] will fax labs performed today.

## 2016-10-04 ENCOUNTER — Encounter (HOSPITAL_COMMUNITY): Payer: Self-pay | Admitting: Emergency Medicine

## 2016-10-04 ENCOUNTER — Emergency Department (HOSPITAL_COMMUNITY)
Admission: EM | Admit: 2016-10-04 | Discharge: 2016-10-04 | Disposition: A | Discharge status: Home / Self Care | Payer: Managed Care, Other (non HMO) | Source: Home / Self Care | Attending: Emergency Medicine | Admitting: Emergency Medicine

## 2016-10-04 DIAGNOSIS — N289 Disorder of kidney and ureter, unspecified: Secondary | ICD-10-CM

## 2016-10-04 DIAGNOSIS — Z853 Personal history of malignant neoplasm of breast: Secondary | ICD-10-CM

## 2016-10-04 DIAGNOSIS — N390 Urinary tract infection, site not specified: Secondary | ICD-10-CM | POA: Diagnosis not present

## 2016-10-04 HISTORY — DX: Dehydration: E86.0

## 2016-10-04 LAB — I-STAT CHEM 8, ED
BUN: 15 mg/dL (ref 6–20)
CALCIUM ION: 1.17 mmol/L (ref 1.15–1.40)
CHLORIDE: 101 mmol/L (ref 101–111)
CREATININE: 1.7 mg/dL — AB (ref 0.44–1.00)
Glucose, Bld: 109 mg/dL — ABNORMAL HIGH (ref 65–99)
HCT: 33 % — ABNORMAL LOW (ref 36.0–46.0)
Hemoglobin: 11.2 g/dL — ABNORMAL LOW (ref 12.0–15.0)
Potassium: 3.6 mmol/L (ref 3.5–5.1)
Sodium: 136 mmol/L (ref 135–145)
TCO2: 24 mmol/L (ref 0–100)

## 2016-10-04 MED ORDER — CEPHALEXIN 500 MG PO CAPS: 500.0000 mg | ORAL_CAPSULE | Freq: Three times a day (TID) | ORAL | 0 refills | Status: DC

## 2016-10-04 NOTE — ED Triage Notes (Signed)
Per previous note- pt is here for repeat creatinine

## 2016-10-04 NOTE — ED Notes (Signed)
Patient verbalizes understanding of discharge instructions, home care and follow up care. Patient out of department at this time with family. 

## 2016-10-04 NOTE — ED Provider Notes (Signed)
[facility] DEPT Provider Note   CSN: [id number] Arrival date & time: 10/04/16  1354     History   Chief Complaint Chief Complaint  Patient presents with  . Labs Only    HPI [name] is a 56 y.o. female.  HPI Patient presents to the emergency department after evaluation yesterday for urinary tract infection found to have renal insufficiency.  She was told to come back to the emergency department today for recheck of her creatinine.  She is feeling better at this time.  She reports no nausea or vomiting.  She denies fevers.  She's been keeping fluids down.  She's been tolerating her antibiotics.   Past Medical History:  Diagnosis Date  . Allergic rhinitis, cause unspecified   . Breast cancer ([place])    chemo; left  . Dehydration   . Migraine   . Unspecified asthma(493.90)     Patient Active Problem List   Diagnosis Date Noted  . Cancer of left breast, stage 2 ([place]) 05/24/2014  . Acute maxillary sinusitis 09/20/2011  . Seasonal and perennial allergic rhinitis 10/25/2007  . 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 MASTECTOMY, LEFT, HX OF 10/25/2007    Past Surgical History:  Procedure Laterality Date  . LAPAROSCOPIC APPENDECTOMY N/A 03/27/2013   Procedure: APPENDECTOMY LAPAROSCOPIC;  Surgeon: Leighton Ruff, MD;  Location: [facility];  Service: General;  Laterality: N/A;  . MASTECTOMY     left; chemo  . SINUS SURGERY WITH INSTATRAK      OB History    No data available       Home Medications    Prior to Admission medications   Medication Sig Start Date End Date Taking? Authorizing Provider  albuterol (PROVENTIL HFA;VENTOLIN HFA) 108 (90 Base) MCG/ACT inhaler Inhale 2 puffs into the lungs every 6 (six) hours as needed for wheezing or shortness of breath. 07/21/16   Deneise Lever, MD  ALPRAZolam Duanne Moron) 0.5 MG tablet Take 0.5 mg by mouth daily as needed for anxiety.     Historical Provider, MD  amphetamine-dextroamphetamine (ADDERALL XR) 30 MG 24 hr  capsule Take 30 mg by mouth See admin instructions. Takes only Monday through Friday    Historical Provider, MD  buPROPion (WELLBUTRIN XL) 300 MG 24 hr tablet Take 300 mg by mouth daily.    Historical Provider, MD  cephALEXin (KEFLEX) 500 MG capsule Take 1 capsule (500 mg total) by mouth 3 (three) times daily. 10/04/16   Lacretia Leigh, MD  cholecalciferol (VITAMIN D) 400 units TABS tablet Take 800 Units by mouth daily.    Historical Provider, MD  cyclobenzaprine (FLEXERIL) 10 MG tablet Take 1 tablet by mouth at bedtime.  10/01/15   Historical Provider, MD  MAGNESIUM CITRATE PO Take 1 tablet by mouth daily.    Historical Provider, MD  NONFORMULARY OR COMPOUNDED ITEM Inject 2 each into the muscle once a week. Allergy Vaccine 2 injections 1 time a week Given at [facility] Provider, MD  rizatriptan (MAXALT) 10 MG tablet Take 10 mg by mouth every 2 (two) hours as needed for migraine.  12/12/15   Historical Provider, MD    Family History Family History  Problem Relation Age of Onset  . Other Father     Lambert-Eaton neuromuscular degenerative disease  . Stroke Mother     Social History Social History  Substance Use Topics  . Smoking status: Never Smoker  . Smokeless tobacco: Never Used  . Alcohol use No     Allergies  Patient has no known allergies.   Review of Systems Review of Systems  All other systems reviewed and are negative.    Physical Exam Updated Vital Signs BP 101/67   Pulse 103   Temp 98.3 F (36.8 C)   Resp 13   SpO2 99%   Physical Exam  Constitutional: She is oriented to person, place, and time. She appears well-developed and well-nourished. No distress.  HENT:  Head: Normocephalic and atraumatic.  Eyes: EOM are normal.  Neck: Normal range of motion.  Cardiovascular: Normal rate.   Pulmonary/Chest: Effort normal.  Musculoskeletal: Normal range of motion.  Neurological: She is alert and oriented to person, place, and time.  Skin: Skin is warm and  dry.  Psychiatric: She has a normal mood and affect. Judgment normal.  Nursing note and vitals reviewed.    ED Treatments / Results  Labs (all labs ordered are listed, but only abnormal results are displayed) Labs Reviewed  I-STAT CHEM 8, ED - Abnormal; Notable for the following:       Result Value   Creatinine, Ser 1.70 (*)    Glucose, Bld 109 (*)    Hemoglobin 11.2 (*)    HCT 33.0 (*)    All other components within normal limits    BUN  Date Value Ref Range Status  10/04/2016 15 6 - 20 mg/dL Final  10/03/2016 21 (H) 6 - 20 mg/dL Final  06/02/2016 12.0 7.0 - 26.0 mg/dL Final  05/24/2014 12.0 7.0 - 26.0 mg/dL Final  03/27/2013 12 6 - 23 mg/dL Final  07/07/2012 12.0 7.0 - 26.0 mg/dL Final  07/04/2011 13 6 - 23 mg/dL Final   Creatinine  Date Value Ref Range Status  06/02/2016 1.1 0.6 - 1.1 mg/dL Final  05/24/2014 0.9 0.6 - 1.1 mg/dL Final  07/07/2012 1.0 0.6 - 1.1 mg/dL Final   Creatinine, Ser  Date Value Ref Range Status  10/04/2016 1.70 (H) 0.44 - 1.00 mg/dL Final  10/03/2016 1.80 (H) 0.44 - 1.00 mg/dL Final  03/27/2013 0.90 0.50 - 1.10 mg/dL Final  07/04/2011 1.06 0.50 - 1.10 mg/dL Final    Comment:    ** Please note change in reference range(s). ++       EKG  EKG Interpretation None       Radiology Dg Chest 2 View  Result Date: 10/03/2016 CLINICAL DATA:  Sporadic coughing. EXAM: CHEST  2 VIEW COMPARISON:  None. FINDINGS: Cardiomediastinal silhouette is normal. Mediastinal contours appear intact. There is no evidence of focal airspace consolidation, pleural effusion or pneumothorax. Osseous structures are without acute abnormality. Postsurgical changes in the chest wall noted. IMPRESSION: No active cardiopulmonary disease. Electronically Signed   By: Fidela Salisbury M.D.   On: 10/03/2016 16:36    Procedures Procedures (including critical care time)  Medications Ordered in ED Medications - No data to display   Initial Impression / Assessment and  Plan / ED Course  I have reviewed the triage vital signs and the nursing notes.  Pertinent labs & imaging results that were available during my care of the patient were reviewed by me and considered in my medical decision making (see chart for details).     Renal function improved from 1.8 1.7.  I told her to avoid NSAIDs at this time.  She will need primary care follow-up for repeat creatinine in 10 days.  She is otherwise improving from urinary tract infection.   Final Clinical Impressions(s) / ED Diagnoses   Final diagnoses:  Renal insufficiency  New Prescriptions Discharge Medication List as of 10/04/2016  4:06 PM       Jola Schmidt, MD 10/04/16 1615

## 2016-10-04 NOTE — Discharge Instructions (Signed)
Return tomorrow morning for a repeat creatinine

## 2016-10-04 NOTE — ED Triage Notes (Signed)
Pt has returned to the ED to have her lab work repeated as directed by EDP last night. Pt is alert, oriented and appropriate. Family at bedside

## 2016-10-06 LAB — URINE CULTURE: Culture: >=100000 — AB

## 2016-10-07 ENCOUNTER — Telehealth (HOSPITAL_BASED_OUTPATIENT_CLINIC_OR_DEPARTMENT_OTHER): Payer: Self-pay | Admitting: Emergency Medicine

## 2016-10-07 NOTE — Telephone Encounter (Signed)
Post ED Visit - Positive Culture Follow-up  Culture report reviewed by antimicrobial stewardship pharmacist:  []  Elenor Quinones, Pharm.D. []  Heide Guile, Pharm.D., BCPS []  Parks Neptune, Pharm.D. []  Alycia Rossetti, Pharm.D., BCPS []  Rudolph, [place].D., BCPS, AAHIVP []  Legrand Como, Pharm.D., BCPS, AAHIVP []  Milus Glazier, Pharm.D. []  Stephens November, [place].DDemetrius Charity PharmD  Positive urine culture Treated with cephalexin, organism sensitive to the same and no further patient follow-up is required at this time.  Hazle Nordmann 10/07/2016, 1:14 PM

## 2016-12-30 ENCOUNTER — Other Ambulatory Visit: Payer: Self-pay | Admitting: Obstetrics and Gynecology

## 2016-12-30 ENCOUNTER — Other Ambulatory Visit (HOSPITAL_COMMUNITY)
Admission: RE | Admit: 2016-12-30 | Discharge: 2016-12-30 | Disposition: A | Discharge status: Home / Self Care | Payer: Managed Care, Other (non HMO) | Source: Ambulatory Visit | Attending: Obstetrics and Gynecology | Admitting: Obstetrics and Gynecology

## 2016-12-30 DIAGNOSIS — Z01419 Encounter for gynecological examination (general) (routine) without abnormal findings: Secondary | ICD-10-CM | POA: Insufficient documentation

## 2016-12-30 DIAGNOSIS — Z1151 Encounter for screening for human papillomavirus (HPV): Secondary | ICD-10-CM | POA: Insufficient documentation

## 2017-01-02 LAB — CYTOLOGY - PAP
Diagnosis: NEGATIVE
HPV (WINDOPATH): NOT DETECTED

## 2017-05-25 ENCOUNTER — Other Ambulatory Visit: Payer: Self-pay | Admitting: Family Medicine

## 2017-05-25 DIAGNOSIS — Z1231 Encounter for screening mammogram for malignant neoplasm of breast: Secondary | ICD-10-CM

## 2017-05-29 ENCOUNTER — Other Ambulatory Visit: Payer: Self-pay | Admitting: Family Medicine

## 2017-05-29 DIAGNOSIS — N63 Unspecified lump in unspecified breast: Secondary | ICD-10-CM

## 2017-05-29 DIAGNOSIS — N6489 Other specified disorders of breast: Secondary | ICD-10-CM

## 2017-05-29 DIAGNOSIS — Z1231 Encounter for screening mammogram for malignant neoplasm of breast: Secondary | ICD-10-CM

## 2017-06-05 ENCOUNTER — Ambulatory Visit
Admission: RE | Admit: 2017-06-05 | Discharge: 2017-06-05 | Disposition: A | Discharge status: Home / Self Care | Payer: Managed Care, Other (non HMO) | Source: Ambulatory Visit | Attending: Family Medicine | Admitting: Family Medicine

## 2017-06-05 ENCOUNTER — Ambulatory Visit: Payer: Managed Care, Other (non HMO)

## 2017-06-05 DIAGNOSIS — N63 Unspecified lump in unspecified breast: Secondary | ICD-10-CM

## 2017-07-28 ENCOUNTER — Ambulatory Visit: Payer: Managed Care, Other (non HMO) | Admitting: Internal Medicine

## 2017-09-29 DIAGNOSIS — N183 Chronic kidney disease, stage 3 (moderate): Secondary | ICD-10-CM | POA: Diagnosis not present

## 2017-09-29 DIAGNOSIS — N390 Urinary tract infection, site not specified: Secondary | ICD-10-CM | POA: Diagnosis not present

## 2017-10-15 DIAGNOSIS — N390 Urinary tract infection, site not specified: Secondary | ICD-10-CM | POA: Diagnosis not present

## 2017-10-22 DIAGNOSIS — J309 Allergic rhinitis, unspecified: Secondary | ICD-10-CM | POA: Diagnosis not present

## 2017-12-01 DIAGNOSIS — G43009 Migraine without aura, not intractable, without status migrainosus: Secondary | ICD-10-CM | POA: Diagnosis not present

## 2018-01-01 DIAGNOSIS — Z01419 Encounter for gynecological examination (general) (routine) without abnormal findings: Secondary | ICD-10-CM | POA: Diagnosis not present

## 2018-01-04 DIAGNOSIS — Z79899 Other long term (current) drug therapy: Secondary | ICD-10-CM | POA: Diagnosis not present

## 2018-01-04 DIAGNOSIS — F338 Other recurrent depressive disorders: Secondary | ICD-10-CM | POA: Diagnosis not present

## 2018-01-04 DIAGNOSIS — F419 Anxiety disorder, unspecified: Secondary | ICD-10-CM | POA: Diagnosis not present

## 2018-01-04 DIAGNOSIS — F902 Attention-deficit hyperactivity disorder, combined type: Secondary | ICD-10-CM | POA: Diagnosis not present

## 2018-01-29 DIAGNOSIS — M531 Cervicobrachial syndrome: Secondary | ICD-10-CM | POA: Diagnosis not present

## 2018-01-29 DIAGNOSIS — M9902 Segmental and somatic dysfunction of thoracic region: Secondary | ICD-10-CM | POA: Diagnosis not present

## 2018-01-29 DIAGNOSIS — M9901 Segmental and somatic dysfunction of cervical region: Secondary | ICD-10-CM | POA: Diagnosis not present

## 2018-01-29 DIAGNOSIS — M5481 Occipital neuralgia: Secondary | ICD-10-CM | POA: Diagnosis not present

## 2018-01-31 IMAGING — CR DG CHEST 2V
2 series · 2 of 2 positions shown · non-contrast
Comparison: None.

CLINICAL DATA: Sporadic coughing.

EXAM:
CHEST  2 VIEW

[w chest pa]
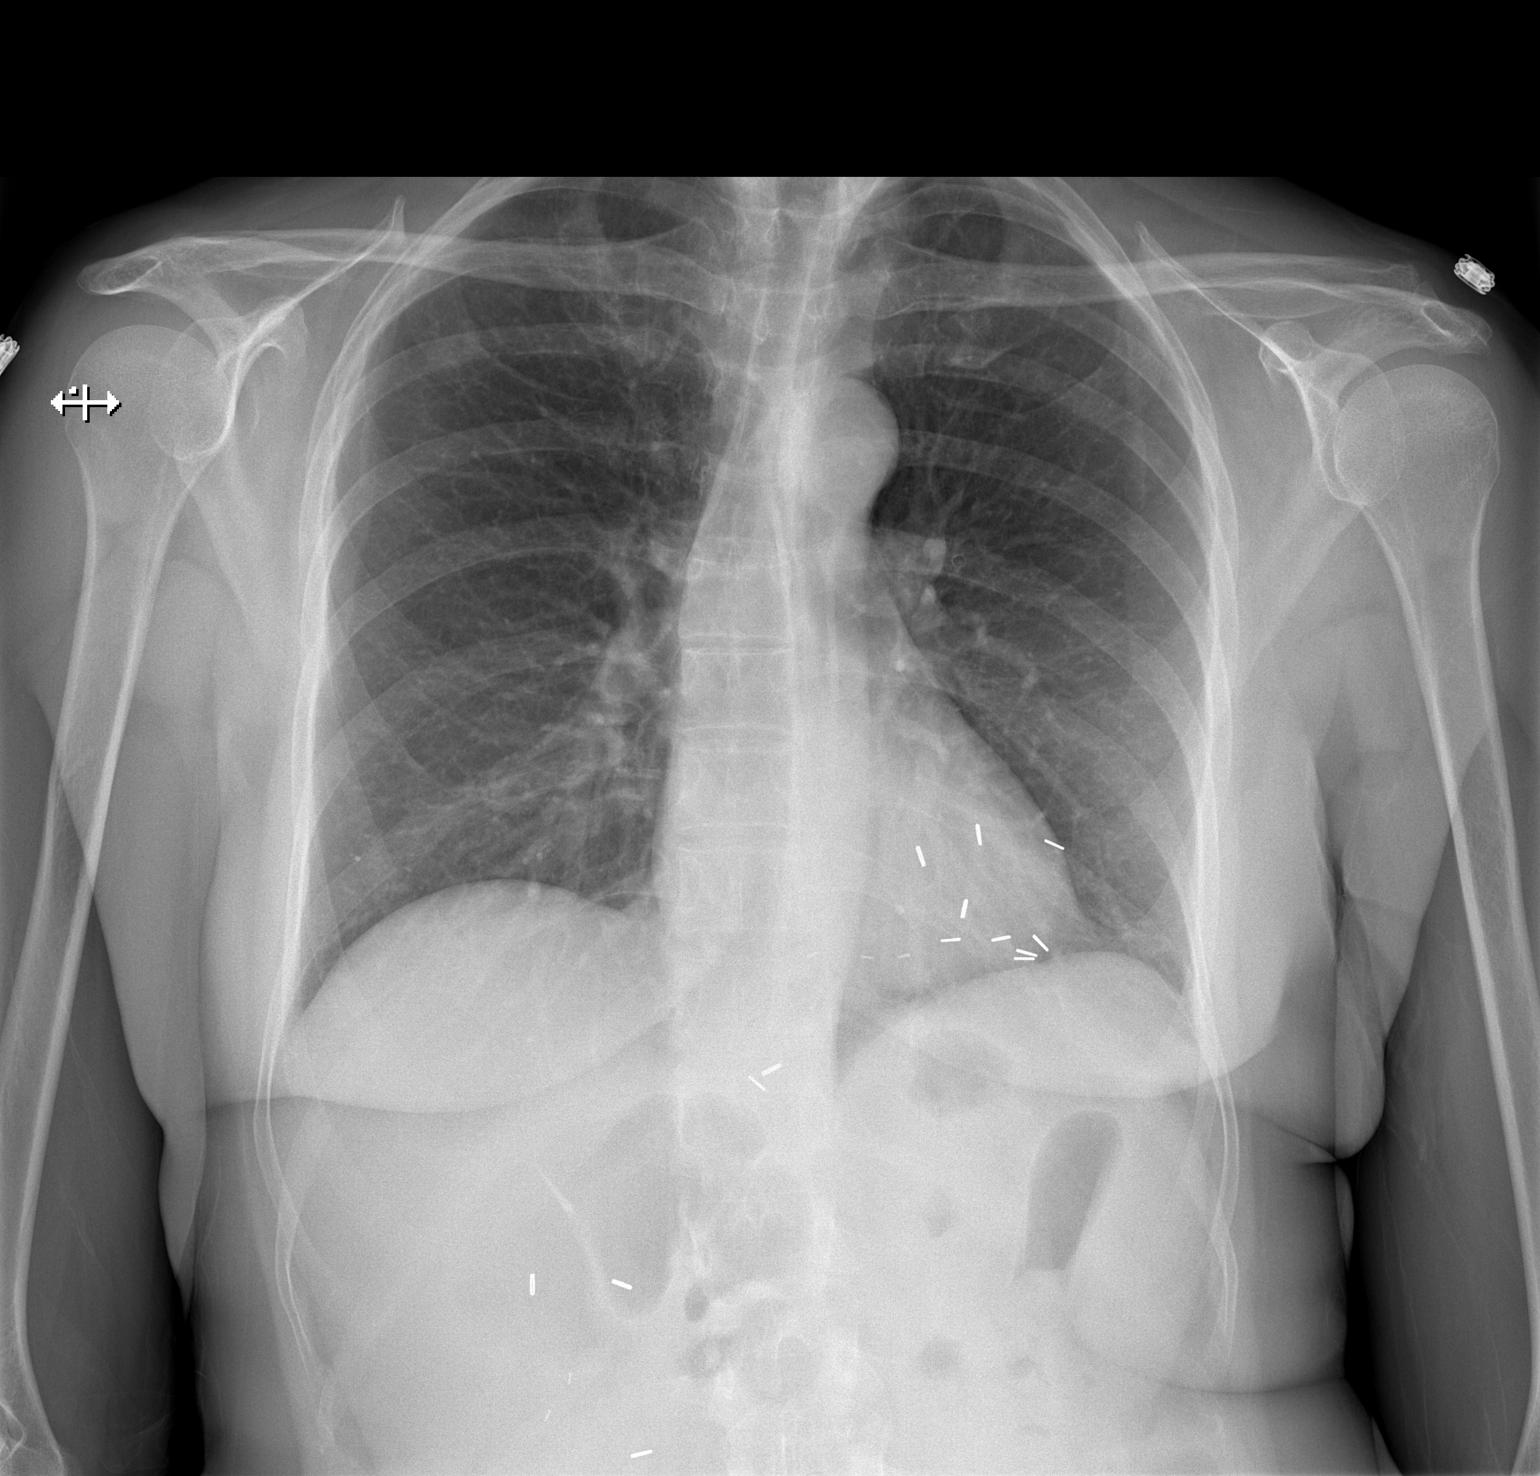

[w chest lat]
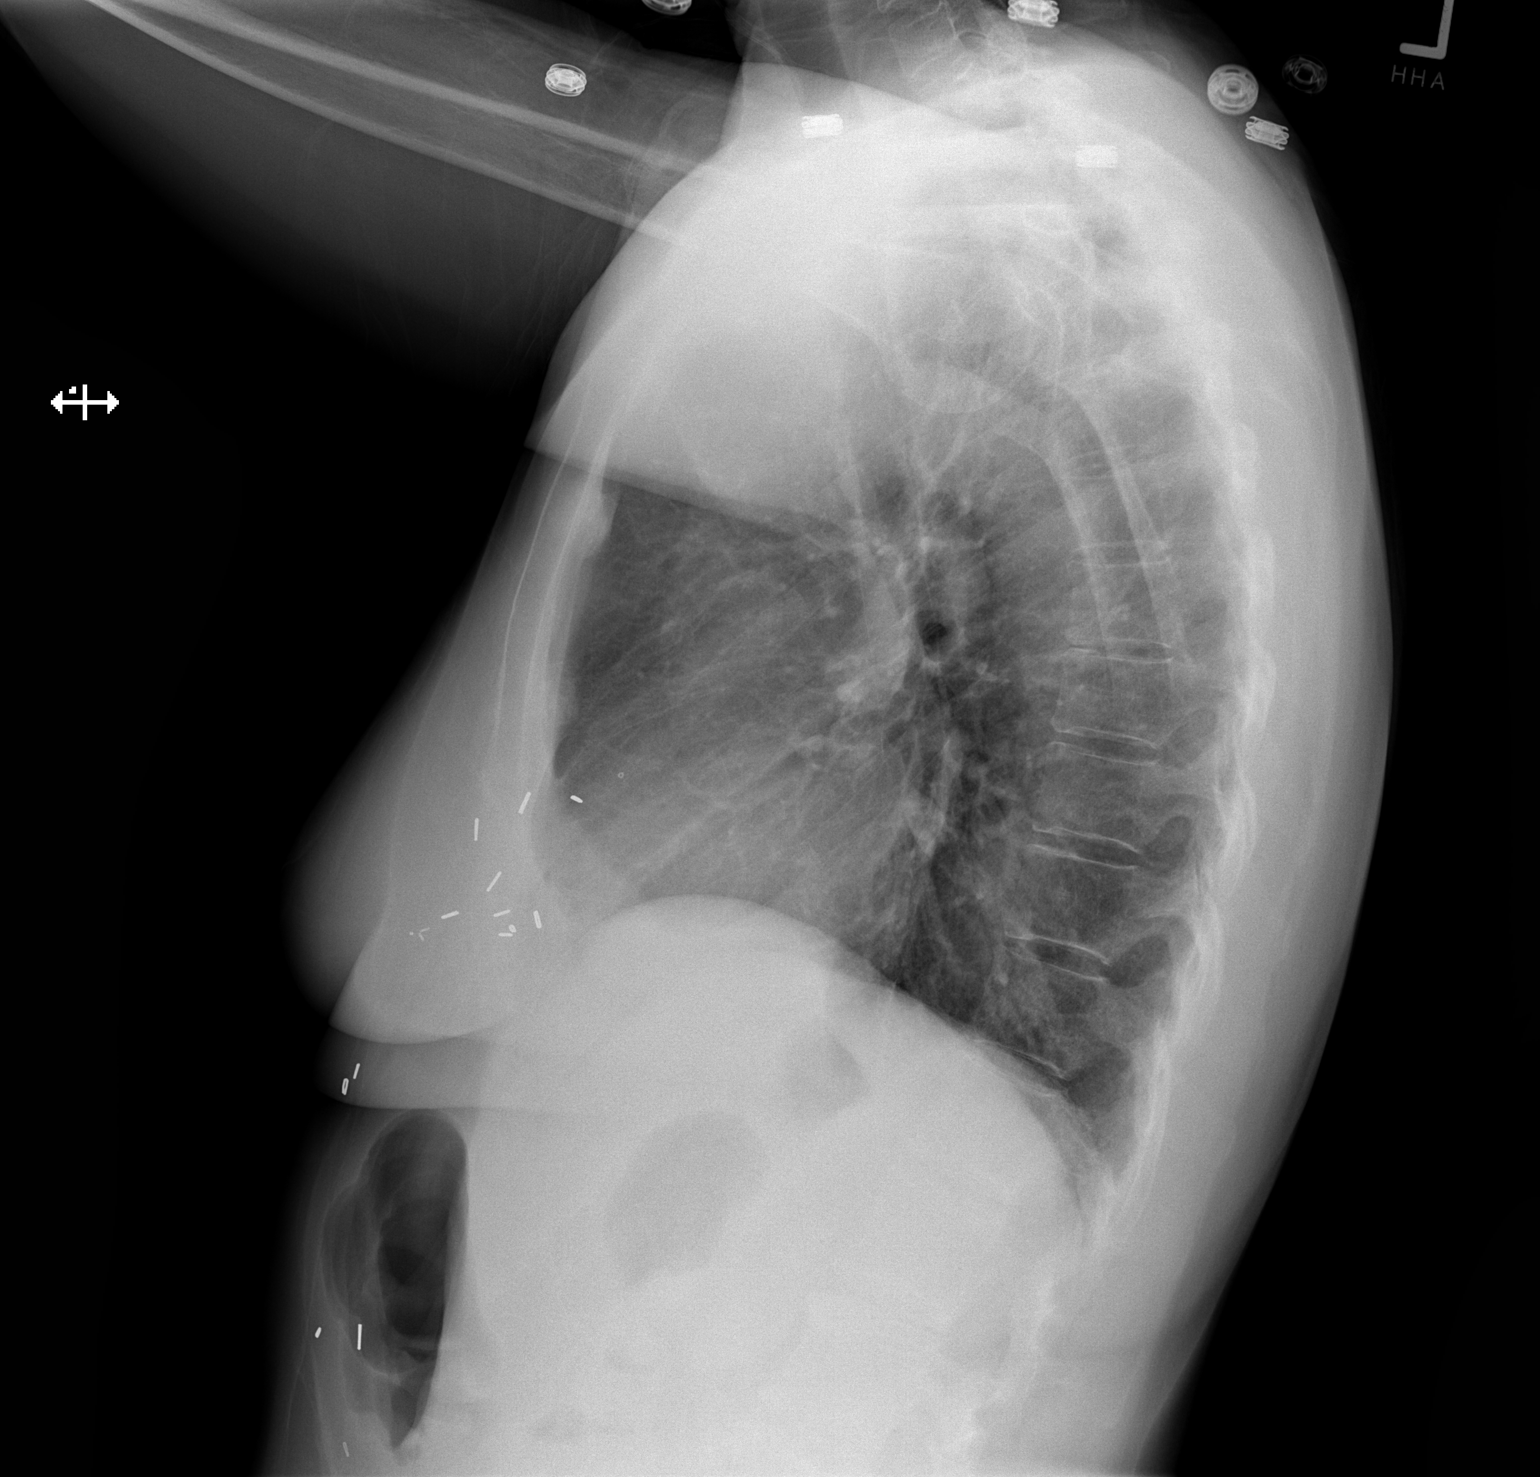

[2 of 2 positions shown; findings below may reference images not displayed]

FINDINGS: Cardiomediastinal silhouette is normal. Mediastinal contours appear
intact.

There is no evidence of focal airspace consolidation, pleural
effusion or pneumothorax.

Osseous structures are without acute abnormality. Postsurgical
changes in the chest wall noted.
IMPRESSION: No active cardiopulmonary disease.

## 2018-02-25 DIAGNOSIS — Z0000 Encounter for general adult medical examination without abnormal findings: Secondary | ICD-10-CM | POA: Diagnosis not present

## 2018-02-25 DIAGNOSIS — Z853 Personal history of malignant neoplasm of breast: Secondary | ICD-10-CM | POA: Diagnosis not present

## 2018-02-25 DIAGNOSIS — Z23 Encounter for immunization: Secondary | ICD-10-CM | POA: Diagnosis not present

## 2018-02-25 DIAGNOSIS — E7800 Pure hypercholesterolemia, unspecified: Secondary | ICD-10-CM | POA: Diagnosis not present

## 2018-02-25 DIAGNOSIS — N183 Chronic kidney disease, stage 3 (moderate): Secondary | ICD-10-CM | POA: Diagnosis not present

## 2018-03-05 DIAGNOSIS — R8290 Unspecified abnormal findings in urine: Secondary | ICD-10-CM | POA: Diagnosis not present

## 2018-03-29 DIAGNOSIS — F419 Anxiety disorder, unspecified: Secondary | ICD-10-CM | POA: Diagnosis not present

## 2018-03-29 DIAGNOSIS — Z79899 Other long term (current) drug therapy: Secondary | ICD-10-CM | POA: Diagnosis not present

## 2018-03-29 DIAGNOSIS — F902 Attention-deficit hyperactivity disorder, combined type: Secondary | ICD-10-CM | POA: Diagnosis not present

## 2018-03-29 DIAGNOSIS — F338 Other recurrent depressive disorders: Secondary | ICD-10-CM | POA: Diagnosis not present

## 2018-04-16 DIAGNOSIS — M9902 Segmental and somatic dysfunction of thoracic region: Secondary | ICD-10-CM | POA: Diagnosis not present

## 2018-04-16 DIAGNOSIS — M5481 Occipital neuralgia: Secondary | ICD-10-CM | POA: Diagnosis not present

## 2018-04-16 DIAGNOSIS — M531 Cervicobrachial syndrome: Secondary | ICD-10-CM | POA: Diagnosis not present

## 2018-04-16 DIAGNOSIS — M9901 Segmental and somatic dysfunction of cervical region: Secondary | ICD-10-CM | POA: Diagnosis not present

## 2018-04-30 DIAGNOSIS — Z23 Encounter for immunization: Secondary | ICD-10-CM | POA: Diagnosis not present

## 2018-06-09 DIAGNOSIS — M9902 Segmental and somatic dysfunction of thoracic region: Secondary | ICD-10-CM | POA: Diagnosis not present

## 2018-06-09 DIAGNOSIS — M531 Cervicobrachial syndrome: Secondary | ICD-10-CM | POA: Diagnosis not present

## 2018-06-09 DIAGNOSIS — M5481 Occipital neuralgia: Secondary | ICD-10-CM | POA: Diagnosis not present

## 2018-06-09 DIAGNOSIS — M9901 Segmental and somatic dysfunction of cervical region: Secondary | ICD-10-CM | POA: Diagnosis not present

## 2018-06-14 ENCOUNTER — Other Ambulatory Visit: Payer: Self-pay | Admitting: Family Medicine

## 2018-06-14 DIAGNOSIS — Z1231 Encounter for screening mammogram for malignant neoplasm of breast: Secondary | ICD-10-CM

## 2018-06-22 DIAGNOSIS — N183 Chronic kidney disease, stage 3 (moderate): Secondary | ICD-10-CM | POA: Diagnosis not present

## 2018-06-24 DIAGNOSIS — F902 Attention-deficit hyperactivity disorder, combined type: Secondary | ICD-10-CM | POA: Diagnosis not present

## 2018-06-24 DIAGNOSIS — F338 Other recurrent depressive disorders: Secondary | ICD-10-CM | POA: Diagnosis not present

## 2018-06-24 DIAGNOSIS — F419 Anxiety disorder, unspecified: Secondary | ICD-10-CM | POA: Diagnosis not present

## 2018-06-24 DIAGNOSIS — Z79899 Other long term (current) drug therapy: Secondary | ICD-10-CM | POA: Diagnosis not present

## 2018-07-07 DIAGNOSIS — H43391 Other vitreous opacities, right eye: Secondary | ICD-10-CM | POA: Diagnosis not present

## 2018-07-20 ENCOUNTER — Other Ambulatory Visit: Payer: Self-pay | Admitting: Family Medicine

## 2018-07-20 ENCOUNTER — Ambulatory Visit
Admission: RE | Admit: 2018-07-20 | Discharge: 2018-07-20 | Disposition: A | Discharge status: Home / Self Care | Payer: BLUE CROSS/BLUE SHIELD | Source: Ambulatory Visit | Attending: Family Medicine | Admitting: Family Medicine

## 2018-07-20 DIAGNOSIS — Z1231 Encounter for screening mammogram for malignant neoplasm of breast: Secondary | ICD-10-CM | POA: Diagnosis not present

## 2018-09-23 DIAGNOSIS — F338 Other recurrent depressive disorders: Secondary | ICD-10-CM | POA: Diagnosis not present

## 2018-09-23 DIAGNOSIS — Z79899 Other long term (current) drug therapy: Secondary | ICD-10-CM | POA: Diagnosis not present

## 2018-09-23 DIAGNOSIS — F419 Anxiety disorder, unspecified: Secondary | ICD-10-CM | POA: Diagnosis not present

## 2018-09-23 DIAGNOSIS — H93299 Other abnormal auditory perceptions, unspecified ear: Secondary | ICD-10-CM | POA: Diagnosis not present

## 2018-09-23 DIAGNOSIS — F902 Attention-deficit hyperactivity disorder, combined type: Secondary | ICD-10-CM | POA: Diagnosis not present

## 2018-12-13 DIAGNOSIS — F330 Major depressive disorder, recurrent, mild: Secondary | ICD-10-CM | POA: Diagnosis not present

## 2018-12-13 DIAGNOSIS — G43009 Migraine without aura, not intractable, without status migrainosus: Secondary | ICD-10-CM | POA: Diagnosis not present

## 2018-12-27 DIAGNOSIS — F902 Attention-deficit hyperactivity disorder, combined type: Secondary | ICD-10-CM | POA: Diagnosis not present

## 2018-12-27 DIAGNOSIS — Z79899 Other long term (current) drug therapy: Secondary | ICD-10-CM | POA: Diagnosis not present

## 2019-02-01 DIAGNOSIS — F419 Anxiety disorder, unspecified: Secondary | ICD-10-CM | POA: Diagnosis not present

## 2019-02-01 DIAGNOSIS — F329 Major depressive disorder, single episode, unspecified: Secondary | ICD-10-CM | POA: Diagnosis not present

## 2019-02-21 DIAGNOSIS — M542 Cervicalgia: Secondary | ICD-10-CM | POA: Diagnosis not present

## 2019-02-21 DIAGNOSIS — M546 Pain in thoracic spine: Secondary | ICD-10-CM | POA: Diagnosis not present

## 2019-02-21 DIAGNOSIS — M62830 Muscle spasm of back: Secondary | ICD-10-CM | POA: Diagnosis not present

## 2019-02-21 DIAGNOSIS — G43009 Migraine without aura, not intractable, without status migrainosus: Secondary | ICD-10-CM | POA: Diagnosis not present

## 2019-02-23 DIAGNOSIS — M546 Pain in thoracic spine: Secondary | ICD-10-CM | POA: Diagnosis not present

## 2019-02-23 DIAGNOSIS — G43009 Migraine without aura, not intractable, without status migrainosus: Secondary | ICD-10-CM | POA: Diagnosis not present

## 2019-02-23 DIAGNOSIS — M542 Cervicalgia: Secondary | ICD-10-CM | POA: Diagnosis not present

## 2019-02-23 DIAGNOSIS — M62830 Muscle spasm of back: Secondary | ICD-10-CM | POA: Diagnosis not present

## 2019-02-24 DIAGNOSIS — M542 Cervicalgia: Secondary | ICD-10-CM | POA: Diagnosis not present

## 2019-02-24 DIAGNOSIS — G43009 Migraine without aura, not intractable, without status migrainosus: Secondary | ICD-10-CM | POA: Diagnosis not present

## 2019-02-24 DIAGNOSIS — M546 Pain in thoracic spine: Secondary | ICD-10-CM | POA: Diagnosis not present

## 2019-02-24 DIAGNOSIS — M62830 Muscle spasm of back: Secondary | ICD-10-CM | POA: Diagnosis not present

## 2019-02-28 DIAGNOSIS — G43009 Migraine without aura, not intractable, without status migrainosus: Secondary | ICD-10-CM | POA: Diagnosis not present

## 2019-02-28 DIAGNOSIS — M542 Cervicalgia: Secondary | ICD-10-CM | POA: Diagnosis not present

## 2019-02-28 DIAGNOSIS — M62830 Muscle spasm of back: Secondary | ICD-10-CM | POA: Diagnosis not present

## 2019-02-28 DIAGNOSIS — M546 Pain in thoracic spine: Secondary | ICD-10-CM | POA: Diagnosis not present

## 2019-03-01 DIAGNOSIS — M546 Pain in thoracic spine: Secondary | ICD-10-CM | POA: Diagnosis not present

## 2019-03-01 DIAGNOSIS — M542 Cervicalgia: Secondary | ICD-10-CM | POA: Diagnosis not present

## 2019-03-01 DIAGNOSIS — G43009 Migraine without aura, not intractable, without status migrainosus: Secondary | ICD-10-CM | POA: Diagnosis not present

## 2019-03-01 DIAGNOSIS — M62830 Muscle spasm of back: Secondary | ICD-10-CM | POA: Diagnosis not present

## 2019-03-03 DIAGNOSIS — G43009 Migraine without aura, not intractable, without status migrainosus: Secondary | ICD-10-CM | POA: Diagnosis not present

## 2019-03-03 DIAGNOSIS — M62830 Muscle spasm of back: Secondary | ICD-10-CM | POA: Diagnosis not present

## 2019-03-03 DIAGNOSIS — M542 Cervicalgia: Secondary | ICD-10-CM | POA: Diagnosis not present

## 2019-03-03 DIAGNOSIS — M546 Pain in thoracic spine: Secondary | ICD-10-CM | POA: Diagnosis not present

## 2019-03-08 DIAGNOSIS — M542 Cervicalgia: Secondary | ICD-10-CM | POA: Diagnosis not present

## 2019-03-08 DIAGNOSIS — M62830 Muscle spasm of back: Secondary | ICD-10-CM | POA: Diagnosis not present

## 2019-03-08 DIAGNOSIS — G43009 Migraine without aura, not intractable, without status migrainosus: Secondary | ICD-10-CM | POA: Diagnosis not present

## 2019-03-08 DIAGNOSIS — Z01419 Encounter for gynecological examination (general) (routine) without abnormal findings: Secondary | ICD-10-CM | POA: Diagnosis not present

## 2019-03-08 DIAGNOSIS — M546 Pain in thoracic spine: Secondary | ICD-10-CM | POA: Diagnosis not present

## 2019-03-09 DIAGNOSIS — M542 Cervicalgia: Secondary | ICD-10-CM | POA: Diagnosis not present

## 2019-03-09 DIAGNOSIS — M62830 Muscle spasm of back: Secondary | ICD-10-CM | POA: Diagnosis not present

## 2019-03-09 DIAGNOSIS — G43009 Migraine without aura, not intractable, without status migrainosus: Secondary | ICD-10-CM | POA: Diagnosis not present

## 2019-03-09 DIAGNOSIS — M546 Pain in thoracic spine: Secondary | ICD-10-CM | POA: Diagnosis not present

## 2019-03-10 DIAGNOSIS — M542 Cervicalgia: Secondary | ICD-10-CM | POA: Diagnosis not present

## 2019-03-10 DIAGNOSIS — M62830 Muscle spasm of back: Secondary | ICD-10-CM | POA: Diagnosis not present

## 2019-03-10 DIAGNOSIS — M546 Pain in thoracic spine: Secondary | ICD-10-CM | POA: Diagnosis not present

## 2019-03-10 DIAGNOSIS — G43009 Migraine without aura, not intractable, without status migrainosus: Secondary | ICD-10-CM | POA: Diagnosis not present

## 2019-03-15 DIAGNOSIS — G43009 Migraine without aura, not intractable, without status migrainosus: Secondary | ICD-10-CM | POA: Diagnosis not present

## 2019-03-15 DIAGNOSIS — M546 Pain in thoracic spine: Secondary | ICD-10-CM | POA: Diagnosis not present

## 2019-03-15 DIAGNOSIS — M62830 Muscle spasm of back: Secondary | ICD-10-CM | POA: Diagnosis not present

## 2019-03-15 DIAGNOSIS — M542 Cervicalgia: Secondary | ICD-10-CM | POA: Diagnosis not present

## 2019-03-16 DIAGNOSIS — M546 Pain in thoracic spine: Secondary | ICD-10-CM | POA: Diagnosis not present

## 2019-03-16 DIAGNOSIS — M62830 Muscle spasm of back: Secondary | ICD-10-CM | POA: Diagnosis not present

## 2019-03-16 DIAGNOSIS — M542 Cervicalgia: Secondary | ICD-10-CM | POA: Diagnosis not present

## 2019-03-16 DIAGNOSIS — G43009 Migraine without aura, not intractable, without status migrainosus: Secondary | ICD-10-CM | POA: Diagnosis not present

## 2019-03-17 DIAGNOSIS — M542 Cervicalgia: Secondary | ICD-10-CM | POA: Diagnosis not present

## 2019-03-17 DIAGNOSIS — M546 Pain in thoracic spine: Secondary | ICD-10-CM | POA: Diagnosis not present

## 2019-03-17 DIAGNOSIS — G43009 Migraine without aura, not intractable, without status migrainosus: Secondary | ICD-10-CM | POA: Diagnosis not present

## 2019-03-17 DIAGNOSIS — M62830 Muscle spasm of back: Secondary | ICD-10-CM | POA: Diagnosis not present

## 2019-03-22 DIAGNOSIS — M546 Pain in thoracic spine: Secondary | ICD-10-CM | POA: Diagnosis not present

## 2019-03-22 DIAGNOSIS — M542 Cervicalgia: Secondary | ICD-10-CM | POA: Diagnosis not present

## 2019-03-22 DIAGNOSIS — G43009 Migraine without aura, not intractable, without status migrainosus: Secondary | ICD-10-CM | POA: Diagnosis not present

## 2019-03-22 DIAGNOSIS — M62830 Muscle spasm of back: Secondary | ICD-10-CM | POA: Diagnosis not present

## 2019-03-29 DIAGNOSIS — F902 Attention-deficit hyperactivity disorder, combined type: Secondary | ICD-10-CM | POA: Diagnosis not present

## 2019-03-29 DIAGNOSIS — Z79899 Other long term (current) drug therapy: Secondary | ICD-10-CM | POA: Diagnosis not present

## 2019-03-29 DIAGNOSIS — F338 Other recurrent depressive disorders: Secondary | ICD-10-CM | POA: Diagnosis not present

## 2019-03-29 DIAGNOSIS — F419 Anxiety disorder, unspecified: Secondary | ICD-10-CM | POA: Diagnosis not present

## 2019-06-23 DIAGNOSIS — E7800 Pure hypercholesterolemia, unspecified: Secondary | ICD-10-CM | POA: Diagnosis not present

## 2019-06-23 DIAGNOSIS — N183 Chronic kidney disease, stage 3 unspecified: Secondary | ICD-10-CM | POA: Diagnosis not present

## 2019-06-24 ENCOUNTER — Other Ambulatory Visit: Payer: Self-pay | Admitting: Family Medicine

## 2019-06-24 DIAGNOSIS — F329 Major depressive disorder, single episode, unspecified: Secondary | ICD-10-CM | POA: Diagnosis not present

## 2019-06-24 DIAGNOSIS — N183 Chronic kidney disease, stage 3 unspecified: Secondary | ICD-10-CM | POA: Diagnosis not present

## 2019-06-24 DIAGNOSIS — Z0000 Encounter for general adult medical examination without abnormal findings: Secondary | ICD-10-CM | POA: Diagnosis not present

## 2019-06-24 DIAGNOSIS — R8290 Unspecified abnormal findings in urine: Secondary | ICD-10-CM | POA: Diagnosis not present

## 2019-06-24 DIAGNOSIS — F419 Anxiety disorder, unspecified: Secondary | ICD-10-CM | POA: Diagnosis not present

## 2019-06-24 DIAGNOSIS — Z1231 Encounter for screening mammogram for malignant neoplasm of breast: Secondary | ICD-10-CM

## 2019-06-24 DIAGNOSIS — Z23 Encounter for immunization: Secondary | ICD-10-CM | POA: Diagnosis not present

## 2019-08-16 ENCOUNTER — Ambulatory Visit
Admission: RE | Admit: 2019-08-16 | Discharge: 2019-08-16 | Disposition: A | Discharge status: Home / Self Care | Payer: BC Managed Care – PPO | Source: Ambulatory Visit | Attending: Family Medicine | Admitting: Family Medicine

## 2019-08-16 ENCOUNTER — Other Ambulatory Visit: Payer: Self-pay

## 2019-08-16 DIAGNOSIS — Z1231 Encounter for screening mammogram for malignant neoplasm of breast: Secondary | ICD-10-CM

## 2019-08-17 ENCOUNTER — Other Ambulatory Visit: Payer: Self-pay | Admitting: Family Medicine

## 2019-08-17 DIAGNOSIS — R928 Other abnormal and inconclusive findings on diagnostic imaging of breast: Secondary | ICD-10-CM

## 2019-08-19 ENCOUNTER — Other Ambulatory Visit: Payer: Self-pay

## 2019-08-19 ENCOUNTER — Ambulatory Visit: Payer: BC Managed Care – PPO

## 2019-08-19 ENCOUNTER — Ambulatory Visit
Admission: RE | Admit: 2019-08-19 | Discharge: 2019-08-19 | Disposition: A | Discharge status: Home / Self Care | Payer: BC Managed Care – PPO | Source: Ambulatory Visit | Attending: Family Medicine | Admitting: Family Medicine

## 2019-08-19 DIAGNOSIS — R928 Other abnormal and inconclusive findings on diagnostic imaging of breast: Secondary | ICD-10-CM

## 2019-09-13 DIAGNOSIS — H43813 Vitreous degeneration, bilateral: Secondary | ICD-10-CM | POA: Diagnosis not present

## 2019-12-13 DIAGNOSIS — G43009 Migraine without aura, not intractable, without status migrainosus: Secondary | ICD-10-CM | POA: Diagnosis not present

## 2019-12-13 DIAGNOSIS — F330 Major depressive disorder, recurrent, mild: Secondary | ICD-10-CM | POA: Diagnosis not present

## 2020-04-26 ENCOUNTER — Encounter: Payer: Self-pay | Admitting: Genetic Counselor

## 2020-07-23 ENCOUNTER — Other Ambulatory Visit: Payer: Self-pay | Admitting: Family Medicine

## 2020-07-23 DIAGNOSIS — Z1231 Encounter for screening mammogram for malignant neoplasm of breast: Secondary | ICD-10-CM

## 2020-08-17 ENCOUNTER — Other Ambulatory Visit: Payer: Self-pay | Admitting: Family Medicine

## 2020-08-17 DIAGNOSIS — M8580 Other specified disorders of bone density and structure, unspecified site: Secondary | ICD-10-CM

## 2020-09-04 ENCOUNTER — Ambulatory Visit
Admission: RE | Admit: 2020-09-04 | Discharge: 2020-09-04 | Disposition: A | Discharge status: Home / Self Care | Payer: BC Managed Care – PPO | Source: Ambulatory Visit | Attending: Family Medicine | Admitting: Family Medicine

## 2020-09-04 ENCOUNTER — Other Ambulatory Visit: Payer: Self-pay

## 2020-09-04 DIAGNOSIS — Z1231 Encounter for screening mammogram for malignant neoplasm of breast: Secondary | ICD-10-CM

## 2020-11-27 ENCOUNTER — Ambulatory Visit
Admission: RE | Admit: 2020-11-27 | Discharge: 2020-11-27 | Disposition: A | Discharge status: Home / Self Care | Payer: BC Managed Care – PPO | Source: Ambulatory Visit | Attending: Family Medicine | Admitting: Family Medicine

## 2020-11-27 ENCOUNTER — Other Ambulatory Visit: Payer: Self-pay

## 2020-11-27 DIAGNOSIS — M8580 Other specified disorders of bone density and structure, unspecified site: Secondary | ICD-10-CM

## 2021-09-10 ENCOUNTER — Other Ambulatory Visit: Payer: Self-pay | Admitting: Family Medicine

## 2021-09-10 DIAGNOSIS — Z1231 Encounter for screening mammogram for malignant neoplasm of breast: Secondary | ICD-10-CM

## 2021-09-13 ENCOUNTER — Other Ambulatory Visit: Payer: Self-pay | Admitting: Family Medicine

## 2021-09-13 ENCOUNTER — Ambulatory Visit
Admission: RE | Admit: 2021-09-13 | Discharge: 2021-09-13 | Disposition: A | Discharge status: Home / Self Care | Payer: BC Managed Care – PPO | Source: Ambulatory Visit | Attending: Family Medicine | Admitting: Family Medicine

## 2021-09-13 ENCOUNTER — Other Ambulatory Visit: Payer: Self-pay

## 2021-09-13 DIAGNOSIS — Z1231 Encounter for screening mammogram for malignant neoplasm of breast: Secondary | ICD-10-CM

## 2021-09-13 HISTORY — DX: Personal history of antineoplastic chemotherapy: Z92.21

## 2022-06-06 ENCOUNTER — Ambulatory Visit: Payer: Self-pay | Admitting: Allergy

## 2022-06-20 ENCOUNTER — Ambulatory Visit: Payer: Self-pay | Admitting: Allergy

## 2022-07-23 ENCOUNTER — Ambulatory Visit (INDEPENDENT_AMBULATORY_CARE_PROVIDER_SITE_OTHER): Payer: BC Managed Care – PPO | Admitting: Allergy

## 2022-07-23 ENCOUNTER — Encounter: Payer: Self-pay | Admitting: Allergy

## 2022-07-23 VITALS — BP 94/62 | HR 88 | Temp 97.6°F | Ht 64.0 in | Wt 151.3 lb

## 2022-07-23 DIAGNOSIS — J310 Chronic rhinitis: Secondary | ICD-10-CM

## 2022-07-23 MED ORDER — IPRATROPIUM BROMIDE 0.06 % NA SOLN: 1.0000 | Freq: Three times a day (TID) | NASAL | 5 refills | Status: AC

## 2022-07-23 NOTE — Patient Instructions (Signed)
-   Will obtain environmental allergy panel - Stop taking for now:  flonase, nasacort, astelin as well as zyrtec/claritin - Start taking: Atrovent (ipratropium) 0.06% one spray per nostril 3-4 times daily as needed for runny nose/drainage/post-nasal drip or congestion  - Continue nasal saline rinses 1-2 times daily to remove allergens from the nasal cavities as well as help with mucous clearance (this is especially helpful to do before the nasal sprays are given)  Follow-up in 4-6 months or sooner if needed

## 2022-07-23 NOTE — Progress Notes (Signed)
New Patient Note  RE: [name] MRN: [id number] DOB: 01-14-1961 Date of Office Visit: 07/23/2022  Primary care provider: Darcus Austin, MD (Inactive)  Chief Complaint: allergies  History of present illness: [name] is a 61 y.o. female presenting today for evaluation of rhinitis with an allergic rhinitis history.  She was on allergen immunotherapy for 20+ years with Dr Annamaria Boots.  She has been off immunotherapy for past 3 years or so.  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  She reports having symptoms of congestion and drainage in the past year off allergy shots.  She states it doesn't seem seasonal.  She states she has tried different medications to help and previously would be helped with steroid injection.  She was seeing Dr Annamaria Boots for years with her allergy shots.   Most of the time she just tries to use a nose spray and has flonase, nasacort, astelin.  She will use flonase if she has a lot of drainage.  Sometimes nasacort will help.  Astelin she is not sure if it works. She states the antihistamines (benadryl, zyrtec, claritin) can be too drying and can cause more headaches. She states right now the drainage is thick. She does perform nasal rinses.    She states a history of asthmatic bronchitis.  She has not used albuterol inhaler once in past 2-3 years.     She states she used to have eczema on her arms during the summers but that resolved.    No history of food allergy.    Review of systems: Review of Systems  Constitutional: Negative.   HENT:         See HPI  Eyes: Negative.   Respiratory: Negative.    Cardiovascular: Negative.   Gastrointestinal: Negative.   Musculoskeletal: Negative.   Skin: Negative.   Allergic/Immunologic: Negative.   Neurological: Negative.     All other systems negative unless noted above in HPI  Past medical  history: Past Medical History:  Diagnosis Date   Allergic rhinitis, cause unspecified    Breast cancer ([place])    chemo; left   Dehydration    Migraine    Personal history of chemotherapy    Unspecified asthma(493.90)     Past surgical history: Past Surgical History:  Procedure Laterality Date   BREAST BIOPSY Right 05/2016   LAPAROSCOPIC APPENDECTOMY N/A 03/27/2013   Procedure: APPENDECTOMY LAPAROSCOPIC;  Surgeon: Leighton Ruff, MD;  Location: [facility];  Service: General;  Laterality: N/A;   MASTECTOMY     left; chemo   SINUS SURGERY WITH INSTATRAK      Family history:  Family History  Problem Relation Age of Onset   Allergic rhinitis Mother    Stroke Mother    Other Father        Lambert-Eaton neuromuscular degenerative disease   Allergic rhinitis Sister    Asthma Sister    Food Allergy Sister    Psoriasis Daughter    Immunodeficiency Neg Hx    Angioedema Neg Hx    Eczema Neg Hx    Atopy Neg Hx    Urticaria Neg Hx     Social history: Lives in a townhome with carpeting with gas and heat pump heating and central cooling.  Dog in the home.  No concern for water damage, mildew or roaches in the home.  She is retired.  She denies smoking history.  Medication List: Current Outpatient Medications  Medication Sig Dispense Refill   albuterol (PROVENTIL HFA;VENTOLIN HFA) 108 (90 Base) MCG/ACT inhaler Inhale 2 puffs into the lungs every 6 (six) hours as needed for wheezing or shortness of breath. 1 Inhaler 2   ALPRAZolam (XANAX) 0.5 MG tablet Take 0.5 mg by mouth daily as needed for anxiety.      amphetamine-dextroamphetamine (ADDERALL XR) 30 MG 24 hr capsule Take 30 mg by mouth See admin instructions. Takes only Monday through Friday     Azelastine-Fluticasone 137-50 MCG/ACT SUSP SMARTSIG:1 Spray(s) Both Nares Twice Daily PRN     cetirizine (ZYRTEC) 10 MG tablet Take 10 mg by mouth daily.     Cholecalciferol 125 MCG (5000 UT) TABS Take by mouth.     cyclobenzaprine (FLEXERIL)  10 MG tablet Take 1 tablet by mouth at bedtime.      ipratropium (ATROVENT) 0.06 % nasal spray Place 1 spray into both nostrils 3 (three) times daily. 15 mL 5   Melatonin 3 MG SUBL Place under the tongue.     rizatriptan (MAXALT) 10 MG tablet Take 10 mg by mouth every 2 (two) hours as needed for migraine.      No current facility-administered medications for this visit.    Known medication allergies: No Known Allergies   Physical examination: Blood pressure 94/62, pulse 88, temperature 97.6 F (36.4 C), temperature source Temporal, height '5\' 4"'$  (1.626 m), weight 151 lb 4.8 oz (68.6 kg), SpO2 98 %.  General: Alert, interactive, in no acute distress. HEENT: PERRLA, TMs pearly gray, turbinates minimally edematous with clear discharge, post-pharynx non erythematous. Neck: Supple without lymphadenopathy. Lungs: Clear to auscultation without wheezing, rhonchi or rales. {no increased work of breathing. CV: Normal S1, S2 without murmurs. Abdomen: Nondistended, nontender. Skin: Warm and dry, without lesions or rashes. Extremities:  No clubbing, cyanosis or edema. Neuro:   Grossly intact.  Diagnositics/Labs: None today  Assessment and plan: Rhinitis History of allergic rhinitis s/p many years of allergen immunotherapy.  Skin testing this year was negative.  However she is noting potentially allergic symptoms.   - Will obtain environmental allergy panel at this time.  Advised if negative with negative skin testing then current symptoms would be non-allergic rhinitis.   - Stop taking for now:  flonase, nasacort, astelin as well as zyrtec/claritin - Start taking: Atrovent (ipratropium) 0.06% one spray per nostril 3-4 times daily as needed for runny nose/drainage/post-nasal drip or congestion  - Continue nasal saline rinses 1-2 times daily to remove allergens from the nasal cavities as well as help with mucous clearance (this is especially helpful to do before the nasal sprays are  given)  Follow-up in 4-6 months or sooner if needed  I appreciate the opportunity to take part in [name]'s care. Please do not hesitate to contact me with questions.  Sincerely,   Prudy Feeler, MD Allergy/Immunology [facility]

## 2022-07-25 LAB — ALLERGENS W/TOTAL IGE AREA 2

## 2022-08-25 ENCOUNTER — Other Ambulatory Visit: Payer: Self-pay | Admitting: Family Medicine

## 2022-08-25 DIAGNOSIS — Z1231 Encounter for screening mammogram for malignant neoplasm of breast: Secondary | ICD-10-CM

## 2022-10-16 ENCOUNTER — Ambulatory Visit
Admission: RE | Admit: 2022-10-16 | Discharge: 2022-10-16 | Disposition: A | Discharge status: Home / Self Care | Payer: BC Managed Care – PPO | Source: Ambulatory Visit | Attending: Family Medicine | Admitting: Family Medicine

## 2022-10-16 DIAGNOSIS — Z1231 Encounter for screening mammogram for malignant neoplasm of breast: Secondary | ICD-10-CM

## 2023-01-21 ENCOUNTER — Ambulatory Visit: Payer: Self-pay | Admitting: Allergy

## 2023-03-30 ENCOUNTER — Other Ambulatory Visit: Payer: Self-pay | Admitting: Obstetrics and Gynecology

## 2023-03-30 ENCOUNTER — Encounter: Payer: Self-pay | Admitting: Physician Assistant

## 2023-03-30 ENCOUNTER — Other Ambulatory Visit (HOSPITAL_COMMUNITY)
Admission: RE | Admit: 2023-03-30 | Discharge: 2023-03-30 | Disposition: A | Discharge status: Home / Self Care | Payer: BC Managed Care – PPO | Source: Ambulatory Visit | Attending: Obstetrics and Gynecology | Admitting: Obstetrics and Gynecology

## 2023-03-30 DIAGNOSIS — Z01419 Encounter for gynecological examination (general) (routine) without abnormal findings: Secondary | ICD-10-CM | POA: Insufficient documentation

## 2023-04-01 LAB — CYTOLOGY - PAP
Comment: NEGATIVE
Diagnosis: NEGATIVE
High risk HPV: NEGATIVE

## 2023-06-15 ENCOUNTER — Ambulatory Visit: Payer: BC Managed Care – PPO | Admitting: Physician Assistant

## 2023-06-15 ENCOUNTER — Encounter: Payer: Self-pay | Admitting: Physician Assistant

## 2023-06-15 VITALS — BP 118/68 | HR 60 | Ht 64.0 in | Wt 139.0 lb

## 2023-06-15 DIAGNOSIS — Z860100 Personal history of colon polyps, unspecified: Secondary | ICD-10-CM | POA: Diagnosis not present

## 2023-06-15 DIAGNOSIS — K5909 Other constipation: Secondary | ICD-10-CM | POA: Diagnosis not present

## 2023-06-15 MED ORDER — NA SULFATE-K SULFATE-MG SULF 17.5-3.13-1.6 GM/177ML PO SOLN: 1.0000 | Freq: Once | ORAL | 0 refills | Status: AC

## 2023-06-15 NOTE — Progress Notes (Signed)
Chief Complaint: Discuss colonoscopy  HPI:    [name] is a 62 year old female, previously known to Dr. Kinnie Scales, with a past medical history as listed below including breast cancer, who was referred to me by Shon Hale, * for consideration of a colonoscopy.    Today, patient tells me she previously followed with Dr. Kinnie Scales.  Apparently had a sister with multiple precancerous polyps and was told she needed to have early screening.  Since then she has had a colonoscopy every 5 years.  Her last was about 5 years ago.  She thinks she may have had colon polyps and at least 1 colonoscopy so far, but is due now for repeat.      She has chronic constipation which she has tried to treat with Benefiber and MiraLAX, currently using a product with Senna and it every 3 days or so in order to have a bowel movement.  She is okay with this regimen for now.    Her sister follows with Dr. Myrtie Neither and she request to see him as well.    Denies fever, chills, weight loss or blood in her stool.     Past Medical History:  Diagnosis Date   Allergic rhinitis, cause unspecified    Breast cancer (HCC)    chemo; left   Dehydration    Migraine    Personal history of chemotherapy    Unspecified asthma(493.90)     Past Surgical History:  Procedure Laterality Date   BREAST BIOPSY Right 05/2016   LAPAROSCOPIC APPENDECTOMY N/A 03/27/2013   Procedure: APPENDECTOMY LAPAROSCOPIC;  Surgeon: Romie Levee, MD;  Location: [facility];  Service: General;  Laterality: N/A;   MASTECTOMY     left; chemo   SINUS SURGERY WITH INSTATRAK      Current Outpatient Medications  Medication Sig Dispense Refill   albuterol (PROVENTIL HFA;VENTOLIN HFA) 108 (90 Base) MCG/ACT inhaler Inhale 2 puffs into the lungs every 6 (six) hours as needed for wheezing or shortness of breath. 1 Inhaler 2   ALPRAZolam (XANAX) 0.5 MG tablet Take 0.5 mg by mouth daily as needed for anxiety.      amphetamine-dextroamphetamine (ADDERALL XR) 30 MG  24 hr capsule Take 30 mg by mouth See admin instructions. Takes only Monday through Friday     Azelastine-Fluticasone 137-50 MCG/ACT SUSP SMARTSIG:1 Spray(s) Both Nares Twice Daily PRN     cetirizine (ZYRTEC) 10 MG tablet Take 10 mg by mouth daily.     Cholecalciferol 125 MCG (5000 UT) TABS Take by mouth.     cyclobenzaprine (FLEXERIL) 10 MG tablet Take 1 tablet by mouth at bedtime.      ipratropium (ATROVENT) 0.06 % nasal spray Place 1 spray into both nostrils 3 (three) times daily. 15 mL 5   Melatonin 3 MG SUBL Place under the tongue.     rizatriptan (MAXALT) 10 MG tablet Take 10 mg by mouth every 2 (two) hours as needed for migraine.      No current facility-administered medications for this visit.    Allergies as of 06/15/2023   (No Known Allergies)    Family History  Problem Relation Age of Onset   Allergic rhinitis Mother    Stroke Mother    Other Father        Lambert-Eaton neuromuscular degenerative disease   Allergic rhinitis Sister    Asthma Sister    Food Allergy Sister    Psoriasis Daughter    Immunodeficiency Neg Hx    Angioedema Neg Hx  Eczema Neg Hx    Atopy Neg Hx    Urticaria Neg Hx     Social History   Socioeconomic History   Marital status: Married    Spouse name: Not on file   Number of children: Not on file   Years of education: Not on file   Highest education level: Not on file  Occupational History   Occupation: IT security  Tobacco Use   Smoking status: Never    Passive exposure: Never   Smokeless tobacco: Never  Vaping Use   Vaping status: Never Used  Substance and Sexual Activity   Alcohol use: No   Drug use: No   Sexual activity: Not on file  Other Topics Concern   Not on file  Social History Narrative   Not on file   Social Determinants of Health   Financial Resource Strain: Not on file  Food Insecurity: No Food Insecurity (12/19/2021)   [facility]   Hunger Vital Sign    Worried About Running Out of Food in  the Last Year: Never true    Ran Out of Food in the Last Year: Never true  Transportation Needs: Not on file  Physical Activity: Not on file  Stress: Not on file  Social Connections: Unknown (01/20/2022)   Received from [facility]   Social Network    Social Network: Not on file  Intimate Partner Violence: Unknown (12/12/2021)   Received from [facility]   HITS    Physically Hurt: Not on file    Insult or Talk Down To: Not on file    Threaten Physical Harm: Not on file    Scream or Curse: Not on file    Review of Systems:    Constitutional: No weight loss, fever or chills Skin: No rash Cardiovascular: No chest pain Respiratory: No SOB  Gastrointestinal: See HPI and otherwise negative Genitourinary: No dysuria  Neurological: No headache, dizziness or syncope Musculoskeletal: No new muscle or joint pain Hematologic: No bleeding  Psychiatric: No history of depression or anxiety   Physical Exam:  Vital signs: BP 118/68   Pulse 60   Ht 5\' 4"  (1.626 m)   Wt 139 lb (63 kg)   BMI 23.86 kg/m    Constitutional:   Pleasant Caucasian female appears to be in NAD, Well developed, Well nourished, alert and cooperative Head:  Normocephalic and atraumatic. Eyes:   PEERL, EOMI. No icterus. Conjunctiva pink. Ears:  Normal auditory acuity. Neck:  Supple Throat: Oral cavity and pharynx without inflammation, swelling or lesion.  Respiratory: Respirations even and unlabored. Lungs clear to auscultation bilaterally.   No wheezes, crackles, or rhonchi.  Cardiovascular: Normal S1, S2. No MRG. Regular rate and rhythm. No peripheral edema, cyanosis or pallor.  Gastrointestinal:  Soft, nondistended, nontender. No rebound or guarding. Normal bowel sounds. No appreciable masses or hepatomegaly. Rectal:  Not performed.  Msk:  Symmetrical without gross deformities. Without edema, no deformity or joint abnormality.  Neurologic:  Alert and  oriented x4;  grossly normal neurologically.  Skin:   Dry  and intact without significant lesions or rashes. Psychiatric: Demonstrates good judgement and reason without abnormal affect or behaviors.  RELEVANT LABS AND IMAGING: CBC    Component Value Date/Time   WBC 9.5 10/03/2016 1709   RBC 4.06 10/03/2016 1709   HGB 11.2 (L) 10/04/2016 1526   HGB 13.2 06/02/2016 1413   HCT 33.0 (L) 10/04/2016 1526   HCT 39.6 06/02/2016 1413   PLT 172 10/03/2016 1709  PLT 216 06/02/2016 1413   MCV 90.6 10/03/2016 1709   MCV 93.8 06/02/2016 1413   MCH 32.0 10/03/2016 1709   MCHC 35.3 10/03/2016 1709   RDW 12.5 10/03/2016 1709   RDW 12.9 06/02/2016 1413   LYMPHSABS 0.7 10/03/2016 1709   LYMPHSABS 1.3 06/02/2016 1413   MONOABS 0.9 10/03/2016 1709   MONOABS 0.4 06/02/2016 1413   EOSABS 0.0 10/03/2016 1709   EOSABS 0.2 06/02/2016 1413   BASOSABS 0.0 10/03/2016 1709   BASOSABS 0.0 06/02/2016 1413    CMP     Component Value Date/Time   NA 136 10/04/2016 1526   NA 143 06/02/2016 1413   K 3.6 10/04/2016 1526   K 4.0 06/02/2016 1413   CL 101 10/04/2016 1526   CL 109 (H) 07/07/2012 1540   CO2 25 10/03/2016 1709   CO2 27 06/02/2016 1413   GLUCOSE 109 (H) 10/04/2016 1526   GLUCOSE 96 06/02/2016 1413   GLUCOSE 89 07/07/2012 1540   BUN 15 10/04/2016 1526   BUN 12.0 06/02/2016 1413   CREATININE 1.70 (H) 10/04/2016 1526   CREATININE 1.1 06/02/2016 1413   CALCIUM 9.2 10/03/2016 1709   CALCIUM 9.5 06/02/2016 1413   PROT 7.5 10/03/2016 1709   PROT 6.8 06/02/2016 1413   ALBUMIN 3.8 10/03/2016 1709   ALBUMIN 3.7 06/02/2016 1413   AST 23 10/03/2016 1709   AST 18 06/02/2016 1413   ALT 25 10/03/2016 1709   ALT 22 06/02/2016 1413   ALKPHOS 90 10/03/2016 1709   ALKPHOS 83 06/02/2016 1413   BILITOT 0.8 10/03/2016 1709   BILITOT <0.30 06/02/2016 1413   GFRNONAA 30 (L) 10/03/2016 1709   GFRAA 35 (L) 10/03/2016 1709    Assessment: 1.  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  History of polyps: Describes at least 1 polyp on prior endoscopic workup 3.  Family history of precancerous polyps: In her sister, unsure how many or exactly what but was told to have early screening  Plan: 1.  Patient scheduled for a surveillance colonoscopy given history of polyps with Dr. Myrtie Neither per patient request in the [facility].  Did provide the patient a detailed list of risks for the procedure and she agrees to proceed. Patient is appropriate for endoscopic procedure(s) in the ambulatory ([facility]) setting.  2.  Patient will have a 2-day bowel prep given history of constipation 3.  Briefly discussed products such as Linzess or Amitiza for constipation but patient would like to stay on what she is using currently as she feels okay with it. 4.  Will request records from Dr. Aurora Mask office 5.  Patient to follow in clinic per recommendations after time of procedure.  Hyacinth Meeker, PA-C [facility] 06/15/2023, 10:11 AM  Cc: Shon Hale, *

## 2023-06-15 NOTE — Progress Notes (Signed)
____________________________________________________________  Attending physician addendum:  Thank you for sending this case to me. I have reviewed the entire note and agree with the plan.   Malyssa Maris Danis, MD  ____________________________________________________________  

## 2023-06-15 NOTE — Patient Instructions (Signed)
You have been scheduled for a colonoscopy. Please follow written instructions given to you at your visit today.   Please pick up your prep supplies at the pharmacy within the next 1-3 days.  If you use inhalers (even only as needed), please bring them with you on the day of your procedure.  DO NOT TAKE 7 DAYS PRIOR TO TEST- Trulicity (dulaglutide) Ozempic, Wegovy (semaglutide) Mounjaro (tirzepatide) Bydureon Bcise (exanatide extended release)  DO NOT TAKE 1 DAY PRIOR TO YOUR TEST Rybelsus (semaglutide) Adlyxin (lixisenatide) Victoza (liraglutide) Byetta (exanatide) __________________________________________________________________  _______________________________________________________  If your blood pressure at your visit was 140/90 or greater, please contact your primary care physician to follow up on this.  _______________________________________________________  If you are age 64 or older, your body mass index should be between 23-30. Your Body mass index is 23.86 kg/m. If this is out of the aforementioned range listed, please consider follow up with your Primary Care Provider.  If you are age 25 or younger, your body mass index should be between 19-25. Your Body mass index is 23.86 kg/m. If this is out of the aformentioned range listed, please consider follow up with your Primary Care Provider.   ________________________________________________________  The [facility] providers would like to encourage you to use [facility] to communicate with providers for non-urgent requests or questions.  Due to long hold times on the telephone, sending your provider a message by [facility] may be a faster and more efficient way to get a response.  Please allow 48 business hours for a response.  Please remember that this is for non-urgent requests.  _______________________________________________________

## 2023-07-13 ENCOUNTER — Encounter: Payer: Self-pay | Admitting: Gastroenterology

## 2023-07-16 ENCOUNTER — Encounter: Payer: BC Managed Care – PPO | Admitting: Gastroenterology

## 2023-07-22 ENCOUNTER — Ambulatory Visit: Payer: BC Managed Care – PPO | Admitting: Gastroenterology

## 2023-07-22 ENCOUNTER — Encounter: Payer: Self-pay | Admitting: Gastroenterology

## 2023-07-22 VITALS — BP 119/56 | HR 68 | Temp 98.0°F | Resp 12 | Ht 64.0 in | Wt 139.0 lb

## 2023-07-22 DIAGNOSIS — Z860100 Personal history of colon polyps, unspecified: Secondary | ICD-10-CM | POA: Diagnosis not present

## 2023-07-22 DIAGNOSIS — Z09 Encounter for follow-up examination after completed treatment for conditions other than malignant neoplasm: Secondary | ICD-10-CM

## 2023-07-22 MED ORDER — SODIUM CHLORIDE 0.9 % IV SOLN: 500.0000 mL | INTRAVENOUS | Status: DC

## 2023-07-22 NOTE — Progress Notes (Signed)
History and Physical:  This patient presents for endoscopic testing for: Encounter Diagnosis  Name Primary?   History of colonic polyps Yes    Surveillance colonoscopy today Clinical details in 06/15/23 office consult note, and no significant changes to medical history since then. Chronic constipation reported.  Patient is otherwise without complaints or active issues today.   Past Medical History: Past Medical History:  Diagnosis Date   Allergic rhinitis, cause unspecified    Allergy    Anxiety    Asthma    Breast cancer (HCC)    chemo; left   Breast cancer (HCC)    Cataract    CKD (chronic kidney disease)    Dehydration    Depression    Migraine    Osteopenia    Personal history of chemotherapy    Unspecified asthma(493.90)    UTI (urinary tract infection)      Past Surgical History: Past Surgical History:  Procedure Laterality Date   APPENDECTOMY     BREAST BIOPSY Right 05/2016   COLONOSCOPY     LAPAROSCOPIC APPENDECTOMY N/A 03/27/2013   Procedure: APPENDECTOMY LAPAROSCOPIC;  Surgeon: Romie Levee, MD;  Location: [facility];  Service: General;  Laterality: N/A;   MASTECTOMY     left; chemo   SINUS SURGERY WITH INSTATRAK     38  years ago    Allergies: No Known Allergies  Outpatient Meds: Current Outpatient Medications  Medication Sig Dispense Refill   ALPRAZolam (XANAX) 0.5 MG tablet Take 0.5 mg by mouth daily as needed for anxiety.      Azelastine-Fluticasone 137-50 MCG/ACT SUSP SMARTSIG:1 Spray(s) Both Nares Twice Daily PRN     buPROPion (WELLBUTRIN XL) 300 MG 24 hr tablet Take 300 mg by mouth daily.     Cholecalciferol 125 MCG (5000 UT) TABS Take by mouth.     Melatonin 3 MG SUBL Place under the tongue.     traZODone (DESYREL) 50 MG tablet Take 100 mg by mouth at bedtime as needed.     albuterol (PROVENTIL HFA;VENTOLIN HFA) 108 (90 Base) MCG/ACT inhaler Inhale 2 puffs into the lungs every 6 (six) hours as needed for wheezing or shortness of breath. 1  Inhaler 2   cyclobenzaprine (FLEXERIL) 10 MG tablet Take 1 tablet by mouth at bedtime.      ipratropium (ATROVENT) 0.06 % nasal spray Place 1 spray into both nostrils 3 (three) times daily. 15 mL 5   rizatriptan (MAXALT) 10 MG tablet Take 10 mg by mouth every 2 (two) hours as needed for migraine.      Current Facility-Administered Medications  Medication Dose Route Frequency Provider Last Rate Last Admin   0.9 %  sodium chloride infusion  500 mL Intravenous Continuous Danis, Starr Lake III, MD          ___________________________________________________________________ Objective   Exam:  BP 103/79   Pulse (!) 105   Temp 98 F (36.7 C) (Skin)   Ht 5\' 4"  (1.626 m)   Wt 139 lb (63 kg)   SpO2 95%   BMI 23.86 kg/m   CV: regular , S1/S2 Resp: clear to auscultation bilaterally, normal RR and effort noted GI: soft, no tenderness, with active bowel sounds.   Assessment: Encounter Diagnosis  Name Primary?   History of colonic polyps Yes     Plan: Colonoscopy   The benefits and risks of the planned procedure were described in detail with the patient or (when appropriate) their health care proxy.  Risks were outlined as including, but not  limited to, bleeding, infection, perforation, adverse medication reaction leading to cardiac or pulmonary decompensation, pancreatitis (if ERCP).  The limitation of incomplete mucosal visualization was also discussed.  No guarantees or warranties were given.  The patient is appropriate for an endoscopic procedure in the ambulatory setting.   - Amada Jupiter, MD

## 2023-07-22 NOTE — Progress Notes (Signed)
To pacu, VSS. Report to [email or web address] 

## 2023-07-22 NOTE — Patient Instructions (Signed)
Resume previous diet. Continue present medications. Repeat colonoscopy in 10 years for screening purposes.   YOU HAD AN ENDOSCOPIC PROCEDURE TODAY AT THE [facility]:   Refer to the procedure report that was given to you for any specific questions about what was found during the examination.  If the procedure report does not answer your questions, please call your gastroenterologist to clarify.  If you requested that your care partner not be given the details of your procedure findings, then the procedure report has been included in a sealed envelope for you to review at your convenience later.  YOU SHOULD EXPECT: Some feelings of bloating in the abdomen. Passage of more gas than usual.  Walking can help get rid of the air that was put into your GI tract during the procedure and reduce the bloating. If you had a lower endoscopy (such as a colonoscopy or flexible sigmoidoscopy) you may notice spotting of blood in your stool or on the toilet paper. If you underwent a bowel prep for your procedure, you may not have a normal bowel movement for a few days.  Please Note:  You might notice some irritation and congestion in your nose or some drainage.  This is from the oxygen used during your procedure.  There is no need for concern and it should clear up in a day or so.  SYMPTOMS TO REPORT IMMEDIATELY:  Following lower endoscopy (colonoscopy or flexible sigmoidoscopy):  Excessive amounts of blood in the stool  Significant tenderness or worsening of abdominal pains  Swelling of the abdomen that is new, acute  Fever of 100F or higher  For urgent or emergent issues, a gastroenterologist can be reached at any hour by calling [phone]. Do not use MyChart messaging for urgent concerns.    DIET:  We do recommend a small meal at first, but then you may proceed to your regular diet.  Drink plenty of fluids but you should avoid alcoholic beverages for 24 hours.  ACTIVITY:  You should plan  to take it easy for the rest of today and you should NOT DRIVE or use heavy machinery until tomorrow (because of the sedation medicines used during the test).    FOLLOW UP: Our staff will call the number listed on your records the next business day following your procedure.  We will call around 7:15- 8:00 am to check on you and address any questions or concerns that you may have regarding the information given to you following your procedure. If we do not reach you, we will leave a message.     If any biopsies were taken you will be contacted by phone or by letter within the next 1-3 weeks.  Please call us at [phone] if you have not heard about the biopsies in 3 weeks.    SIGNATURES/CONFIDENTIALITY: You and/or your care partner have signed paperwork which will be entered into your electronic medical record.  These signatures attest to the fact that that the information above on your After Visit Summary has been reviewed and is understood.  Full responsibility of the confidentiality of this discharge information lies with you and/or your care-partner. 

## 2023-07-22 NOTE — Op Note (Signed)
[facility] Patient Name: [name] Procedure Date: 07/22/2023 9:02 AM MRN: [id number] Endoscopist: Sherilyn Cooter L. Myrtie Neither , MD, [id number] Age: 62 Referring MD:  Date of Birth: 1961/01/29 Gender: Female Account #: [id number] Procedure:                Colonoscopy Indications:              Surveillance: Personal history of colonic polyps                            (unknown histology) on last colonoscopy at least 5                            years ago (Dr. Kinnie Scales) Medicines:                Monitored Anesthesia Care Procedure:                Pre-Anesthesia Assessment:                           - Prior to the procedure, a History and Physical                            was performed, and patient medications and                            allergies were reviewed. The patient's tolerance of                            previous anesthesia was also reviewed. The risks                            and benefits of the procedure and the sedation                            options and risks were discussed with the patient.                            All questions were answered, and informed consent                            was obtained. Prior Anticoagulants: The patient has                            taken no anticoagulant or antiplatelet agents. ASA                            Grade Assessment: II - A patient with mild systemic                            disease. After reviewing the risks and benefits,                            the patient was deemed in satisfactory condition to  undergo the procedure.                           After obtaining informed consent, the colonoscope                            was passed under direct vision. Throughout the                            procedure, the patient's blood pressure, pulse, and                            oxygen saturations were monitored continuously. The                            CF [id number] #[id number] was  introduced through the anus                            and advanced to the the cecum, identified by                            appendiceal orifice and ileocecal valve. The                            colonoscopy was somewhat difficult due to a                            redundant colon and a tortuous colon. Successful                            completion of the procedure was aided by                            straightening and shortening the scope to obtain                            bowel loop reduction. The patient tolerated the                            procedure well. The quality of the bowel                            preparation was excellent. The ileocecal valve,                            appendiceal orifice, and rectum were photographed. Scope In: 9:15:45 AM Scope Out: 9:31:12 AM Scope Withdrawal Time: 0 hours 10 minutes 49 seconds  Total Procedure Duration: 0 hours 15 minutes 27 seconds  Findings:                 The perianal and digital rectal examinations were                            normal.  Repeat examination of right colon under NBI                            performed.                           There is no endoscopic evidence of polyps in the                            entire colon.                           Two mucosal scars were found in the distal rectum.                            (Most likely from prior hemorrhoidal banding)                           The exam was otherwise without abnormality on                            direct and retroflexion views. Complications:            No immediate complications. Estimated Blood Loss:     Estimated blood loss: none. Impression:               - Scar in the distal rectum.                           - The examination was otherwise normal on direct                            and retroflexion views.                           - No specimens collected. Recommendation:           - Patient has a contact  number available for                            emergencies. The signs and symptoms of potential                            delayed complications were discussed with the                            patient. Return to normal activities tomorrow.                            Written discharge instructions were provided to the                            patient.                           - Resume previous diet.                           -  Continue present medications.                           - Repeat colonoscopy in 10 years for screening                            purposes. Annamaria Salah L. Myrtie Neither, MD 07/22/2023 9:37:53 AM This report has been signed electronically.

## 2023-07-23 ENCOUNTER — Telehealth: Payer: Self-pay | Admitting: *Deleted

## 2023-07-23 NOTE — Telephone Encounter (Signed)
  Follow up Call-     07/22/2023    8:10 AM  Call back number  Post procedure Call Back phone  # [phone]  Permission to leave phone message Yes     Patient questions:   Message left to call if necessary.

## 2023-08-12 ENCOUNTER — Other Ambulatory Visit: Payer: Self-pay | Admitting: Family Medicine

## 2023-08-12 DIAGNOSIS — M8580 Other specified disorders of bone density and structure, unspecified site: Secondary | ICD-10-CM

## 2023-09-10 DIAGNOSIS — R062 Wheezing: Secondary | ICD-10-CM | POA: Diagnosis not present

## 2023-09-10 DIAGNOSIS — R059 Cough, unspecified: Secondary | ICD-10-CM | POA: Diagnosis not present

## 2023-09-10 DIAGNOSIS — R000 Tachycardia, unspecified: Secondary | ICD-10-CM | POA: Diagnosis not present

## 2023-09-10 DIAGNOSIS — U071 COVID-19: Secondary | ICD-10-CM | POA: Diagnosis not present

## 2023-09-16 ENCOUNTER — Other Ambulatory Visit: Payer: Self-pay | Admitting: Family Medicine

## 2023-09-16 DIAGNOSIS — Z1231 Encounter for screening mammogram for malignant neoplasm of breast: Secondary | ICD-10-CM

## 2023-09-30 DIAGNOSIS — J4520 Mild intermittent asthma, uncomplicated: Secondary | ICD-10-CM | POA: Diagnosis not present

## 2023-09-30 DIAGNOSIS — U071 COVID-19: Secondary | ICD-10-CM | POA: Diagnosis not present

## 2023-10-06 DIAGNOSIS — R059 Cough, unspecified: Secondary | ICD-10-CM | POA: Diagnosis not present

## 2023-10-13 DIAGNOSIS — J00 Acute nasopharyngitis [common cold]: Secondary | ICD-10-CM | POA: Diagnosis not present

## 2023-10-20 ENCOUNTER — Ambulatory Visit
Admission: RE | Admit: 2023-10-20 | Discharge: 2023-10-20 | Disposition: A | Discharge status: Home / Self Care | Payer: Medicare Other | Source: Ambulatory Visit | Attending: Family Medicine | Admitting: Family Medicine

## 2023-10-20 DIAGNOSIS — Z1231 Encounter for screening mammogram for malignant neoplasm of breast: Secondary | ICD-10-CM | POA: Diagnosis not present

## 2024-02-02 DIAGNOSIS — G43109 Migraine with aura, not intractable, without status migrainosus: Secondary | ICD-10-CM | POA: Diagnosis not present

## 2024-02-02 DIAGNOSIS — F419 Anxiety disorder, unspecified: Secondary | ICD-10-CM | POA: Diagnosis not present

## 2024-02-02 DIAGNOSIS — R252 Cramp and spasm: Secondary | ICD-10-CM | POA: Diagnosis not present

## 2024-02-08 ENCOUNTER — Other Ambulatory Visit (HOSPITAL_COMMUNITY): Payer: Self-pay | Admitting: Family Medicine

## 2024-02-08 DIAGNOSIS — I739 Peripheral vascular disease, unspecified: Secondary | ICD-10-CM

## 2024-02-09 ENCOUNTER — Ambulatory Visit (HOSPITAL_COMMUNITY)
Admission: RE | Admit: 2024-02-09 | Discharge: 2024-02-09 | Disposition: A | Discharge status: Home / Self Care | Source: Ambulatory Visit | Attending: Vascular Surgery | Admitting: Vascular Surgery

## 2024-02-09 DIAGNOSIS — I739 Peripheral vascular disease, unspecified: Secondary | ICD-10-CM

## 2024-02-09 LAB — VAS US ABI WITH/WO TBI
Left ABI: 1.09
Right ABI: 1.14

## 2024-03-03 ENCOUNTER — Ambulatory Visit (HOSPITAL_BASED_OUTPATIENT_CLINIC_OR_DEPARTMENT_OTHER)
Admission: RE | Admit: 2024-03-03 | Discharge: 2024-03-03 | Disposition: A | Discharge status: Home / Self Care | Source: Ambulatory Visit | Attending: Family Medicine | Admitting: Family Medicine

## 2024-03-03 DIAGNOSIS — Z13820 Encounter for screening for osteoporosis: Secondary | ICD-10-CM | POA: Diagnosis not present

## 2024-03-03 DIAGNOSIS — M810 Age-related osteoporosis without current pathological fracture: Secondary | ICD-10-CM | POA: Insufficient documentation

## 2024-03-03 DIAGNOSIS — M8580 Other specified disorders of bone density and structure, unspecified site: Secondary | ICD-10-CM

## 2024-03-03 DIAGNOSIS — Z780 Asymptomatic menopausal state: Secondary | ICD-10-CM | POA: Insufficient documentation

## 2024-03-07 DIAGNOSIS — M810 Age-related osteoporosis without current pathological fracture: Secondary | ICD-10-CM | POA: Diagnosis not present

## 2024-03-07 DIAGNOSIS — Z780 Asymptomatic menopausal state: Secondary | ICD-10-CM | POA: Diagnosis not present

## 2024-03-08 ENCOUNTER — Other Ambulatory Visit (HOSPITAL_BASED_OUTPATIENT_CLINIC_OR_DEPARTMENT_OTHER)

## 2024-03-18 DIAGNOSIS — K080 Exfoliation of teeth due to systemic causes: Secondary | ICD-10-CM | POA: Diagnosis not present

## 2024-03-31 ENCOUNTER — Other Ambulatory Visit: Payer: BC Managed Care – PPO

## 2024-04-12 DIAGNOSIS — Z01419 Encounter for gynecological examination (general) (routine) without abnormal findings: Secondary | ICD-10-CM | POA: Diagnosis not present

## 2024-04-15 DIAGNOSIS — K080 Exfoliation of teeth due to systemic causes: Secondary | ICD-10-CM | POA: Diagnosis not present

## 2024-05-24 DIAGNOSIS — H524 Presbyopia: Secondary | ICD-10-CM | POA: Diagnosis not present

## 2024-05-27 DIAGNOSIS — Z23 Encounter for immunization: Secondary | ICD-10-CM | POA: Diagnosis not present

## 2024-06-06 DIAGNOSIS — M79671 Pain in right foot: Secondary | ICD-10-CM | POA: Diagnosis not present

## 2024-08-24 DIAGNOSIS — G43109 Migraine with aura, not intractable, without status migrainosus: Secondary | ICD-10-CM | POA: Diagnosis not present

## 2024-08-24 DIAGNOSIS — M810 Age-related osteoporosis without current pathological fracture: Secondary | ICD-10-CM | POA: Diagnosis not present

## 2024-08-24 DIAGNOSIS — E559 Vitamin D deficiency, unspecified: Secondary | ICD-10-CM | POA: Diagnosis not present

## 2024-08-24 DIAGNOSIS — E7800 Pure hypercholesterolemia, unspecified: Secondary | ICD-10-CM | POA: Diagnosis not present

## 2024-08-24 DIAGNOSIS — Z79899 Other long term (current) drug therapy: Secondary | ICD-10-CM | POA: Diagnosis not present

## 2024-08-24 DIAGNOSIS — Z0000 Encounter for general adult medical examination without abnormal findings: Secondary | ICD-10-CM | POA: Diagnosis not present

## 2024-09-23 ENCOUNTER — Other Ambulatory Visit: Payer: Self-pay | Admitting: Family Medicine

## 2024-09-23 DIAGNOSIS — Z1231 Encounter for screening mammogram for malignant neoplasm of breast: Secondary | ICD-10-CM

## 2024-11-15 ENCOUNTER — Ambulatory Visit
